# Patient Record
Sex: Male | Born: 1965
Health system: Southern US, Community
[De-identification: ages and names within clinical notes are randomized; demographics above are authoritative.]

## PROBLEM LIST (undated history)

## (undated) ENCOUNTER — Emergency Department (HOSPITAL_COMMUNITY): Discharge status: Absent without leave | Payer: Managed Care, Other (non HMO)

## (undated) DIAGNOSIS — F17200 Nicotine dependence, unspecified, uncomplicated: Secondary | ICD-10-CM

## (undated) HISTORY — DX: Nicotine dependence, unspecified, uncomplicated: F17.200

## (undated) HISTORY — PX: TONSILLECTOMY: SUR1361

---

## 2010-04-29 ENCOUNTER — Ambulatory Visit: Payer: Self-pay | Admitting: Family Medicine

## 2010-06-13 ENCOUNTER — Emergency Department (HOSPITAL_COMMUNITY): Admission: EM | Admit: 2010-06-13 | Discharge: 2010-06-13 | Payer: Self-pay | Admitting: Emergency Medicine

## 2010-06-16 ENCOUNTER — Ambulatory Visit: Payer: Self-pay | Admitting: Family Medicine

## 2010-07-28 ENCOUNTER — Ambulatory Visit: Payer: Self-pay | Admitting: Family Medicine

## 2013-01-26 ENCOUNTER — Ambulatory Visit (INDEPENDENT_AMBULATORY_CARE_PROVIDER_SITE_OTHER): Payer: Managed Care, Other (non HMO) | Admitting: Family Medicine

## 2013-01-26 ENCOUNTER — Encounter: Payer: Self-pay | Admitting: Family Medicine

## 2013-01-26 VITALS — BP 124/88 | HR 74 | Ht 70.0 in | Wt 187.0 lb

## 2013-01-26 DIAGNOSIS — Z23 Encounter for immunization: Secondary | ICD-10-CM

## 2013-01-26 DIAGNOSIS — Z0000 Encounter for general adult medical examination without abnormal findings: Secondary | ICD-10-CM

## 2013-01-26 DIAGNOSIS — F17200 Nicotine dependence, unspecified, uncomplicated: Secondary | ICD-10-CM

## 2013-01-26 DIAGNOSIS — L259 Unspecified contact dermatitis, unspecified cause: Secondary | ICD-10-CM

## 2013-01-26 DIAGNOSIS — N500 Atrophy of testis: Secondary | ICD-10-CM

## 2013-01-26 DIAGNOSIS — L309 Dermatitis, unspecified: Secondary | ICD-10-CM

## 2013-01-26 LAB — CBC WITH DIFFERENTIAL/PLATELET
Basophils Absolute: 0 10*3/uL (ref 0.0–0.1)
Eosinophils Absolute: 0.5 10*3/uL (ref 0.0–0.7)
HCT: 43.1 % (ref 39.0–52.0)
Lymphocytes Relative: 31 % (ref 12–46)
MCH: 29.2 pg (ref 26.0–34.0)
MCV: 83.2 fL (ref 78.0–100.0)
Monocytes Absolute: 0.8 10*3/uL (ref 0.1–1.0)
Neutrophils Relative %: 56 % (ref 43–77)
Platelets: 301 10*3/uL (ref 150–400)
RBC: 5.18 MIL/uL (ref 4.22–5.81)

## 2013-01-26 LAB — COMPREHENSIVE METABOLIC PANEL
ALT: 21 U/L (ref 0–53)
AST: 20 U/L (ref 0–37)
Albumin: 4.5 g/dL (ref 3.5–5.2)
Calcium: 9.6 mg/dL (ref 8.4–10.5)
Creat: 0.83 mg/dL (ref 0.50–1.35)
Potassium: 4.3 mEq/L (ref 3.5–5.3)

## 2013-01-26 LAB — LIPID PANEL
Cholesterol: 183 mg/dL (ref 0–200)
LDL Cholesterol: 110 mg/dL — ABNORMAL HIGH (ref 0–99)
Total CHOL/HDL Ratio: 3.4 Ratio
VLDL: 19 mg/dL (ref 0–40)

## 2013-01-26 NOTE — Progress Notes (Signed)
Subjective:    Patient ID: Thomas Day, male    DOB: [birth year], 47 y.o.   MRN: [id number]  HPI He is here for a complete examination. He has had difficulty with skin problems and has seen dermatologist about in the past. This usually bothers him more in the winter months. 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 He smokes and at this time is not ready to quit. His marriage is going well. Work is going well. He has no other concerns or complaints. Social and family history were reviewed. His father has an unknown kind of leukemia.   Review of Systems  Constitutional: Negative.   HENT: Negative.   Eyes: Negative.   Respiratory: Negative.   Cardiovascular: Negative.   Gastrointestinal: Negative.   Genitourinary: Negative.   Musculoskeletal: Negative.   Skin: Negative.   Neurological: Negative.   Hematological: Negative.   Psychiatric/Behavioral: Negative.        Objective:   Physical Exam BP 124/88  Pulse 74  Ht 5\' 10"  (1.778 m)  Wt 187 lb (84.823 kg)  BMI 26.83 kg/m2  General Appearance:    Alert, cooperative, no distress, appears stated age  Head:    Normocephalic, without obvious abnormality, atraumatic  Eyes:    PERRL, conjunctiva/corneas clear, EOM's intact, fundi    benign  Ears:    Normal TM's and external ear canals  Nose:   Nares normal, mucosa normal, no drainage or sinus   tenderness  Throat:   Lips, mucosa, and tongue normal; teeth and gums normal  Neck:   Supple, no lymphadenopathy;  thyroid:  no   enlargement/tenderness/nodules; no carotid   bruit or JVD  Back:    Spine nontender, no curvature, ROM normal, no CVA     tenderness  Lungs:     Clear to auscultation bilaterally without wheezes, rales or     ronchi; respirations unlabored  Chest Wall:    No tenderness or deformity   Heart:    Regular rate and rhythm, S1 and S2 normal, no murmur, rub   or gallop  Breast Exam:    No chest wall  tenderness, masses or gynecomastia  Abdomen:     Soft, non-tender, nondistended, normoactive bowel sounds,    no masses, no hepatosplenomegaly  Genitalia:    Normal male external genitalia without lesions.  Left testicle is atrophied, right is normal.  No inguinal hernias.  Rectal:    Normal sphincter tone, no masses or tenderness; guaiac negative stool.  Prostate smooth, no nodules, not enlarged.  Extremities:   No clubbing, cyanosis or edema  Pulses:   2+ and symmetric all extremities  Skin:   Skin color, texture, turgor normal, no rashes or lesions  Lymph nodes:   Cervical, supraclavicular, and axillary nodes normal  Neurologic:   CNII-XII intact, normal strength, sensation and gait; reflexes 2+ and symmetric throughout          Psych:   Normal mood, affect, hygiene and grooming.           Assessment & Plan:   1. Routine general medical examination at a health care facility  Lipid panel, CBC with Differential, Comprehensive metabolic panel, Hemoccult - 1 Card (office), Tdap vaccine greater than or equal to 7yo IM  2. Testicular atrophy    3. Dermatitis     he'll continue to be followed by dermatology. At this point is not ready to quit smoking.

## 2013-01-27 NOTE — Progress Notes (Signed)
Quick Note:  PT INFORMED LABS LOOK GOOD AND THAT I FAXED HIS INSURANCE PAPER TO COMPANY PT VERBALIZED UNDERSTANDING ______

## 2013-01-27 NOTE — Progress Notes (Signed)
Quick Note:  The blood work is normal ______ 

## 2014-02-08 ENCOUNTER — Encounter: Payer: Self-pay | Admitting: Family Medicine

## 2014-02-08 ENCOUNTER — Ambulatory Visit (INDEPENDENT_AMBULATORY_CARE_PROVIDER_SITE_OTHER): Payer: Managed Care, Other (non HMO) | Admitting: Family Medicine

## 2014-02-08 VITALS — BP 120/82 | HR 72 | Ht 70.0 in | Wt 189.0 lb

## 2014-02-08 DIAGNOSIS — F17200 Nicotine dependence, unspecified, uncomplicated: Secondary | ICD-10-CM

## 2014-02-08 DIAGNOSIS — Z0000 Encounter for general adult medical examination without abnormal findings: Secondary | ICD-10-CM

## 2014-02-08 LAB — LIPID PANEL
CHOLESTEROL: 185 mg/dL (ref 0–200)
HDL: 51 mg/dL (ref >39)
LDL CALC: 115 mg/dL — AB (ref 0–99)
Total CHOL/HDL Ratio: 3.6 Ratio
Triglycerides: 95 mg/dL (ref <150)
VLDL: 19 mg/dL (ref 0–40)

## 2014-02-08 LAB — HEMOCCULT GUIAC POC 1CARD (OFFICE)

## 2014-02-08 NOTE — Progress Notes (Signed)
   Subjective:    Patient ID: Thomas Day, male    DOB: 10/13/1966, 48 y.o.   MRN: [id number]  HPI He is here for complete examination. He has no particular concerns or complaints. His work is going well. His marriage is also going well. He has 2 young children. Social and family history were reviewed. He does smoke and has tried quitting in the past. Health maintenance and immunizations were reviewed.   Review of Systems  All other systems reviewed and are negative.       Objective:   Physical Exam BP 120/82  Pulse 72  Ht 5\' 10"  (1.778 m)  Wt 189 lb (85.73 kg)  BMI 27.12 kg/m2  General Appearance:    Alert, cooperative, no distress, appears stated age  Head:    Normocephalic, without obvious abnormality, atraumatic  Eyes:    PERRL, conjunctiva/corneas clear, EOM's intact, fundi    benign  Ears:    Normal TM's and external ear canals  Nose:   Nares normal, mucosa normal, no drainage or sinus   tenderness  Throat:   Lips, mucosa, and tongue normal; teeth and gums normal  Neck:   Supple, no lymphadenopathy;  thyroid:  no   enlargement/tenderness/nodules; no carotid   bruit or JVD  Back:    Spine nontender, no curvature, ROM normal, no CVA     tenderness  Lungs:     Clear to auscultation bilaterally without wheezes, rales or     ronchi; respirations unlabored  Chest Wall:    No tenderness or deformity   Heart:    Regular rate and rhythm, S1 and S2 normal, no murmur, rub   or gallop  Breast Exam:    No chest wall tenderness, masses or gynecomastia  Abdomen:     Soft, non-tender, nondistended, normoactive bowel sounds,    no masses, no hepatosplenomegaly  Genitalia:   deferred   Rectal:    Normal sphincter tone, no masses or tenderness; guaiac negative stool.  Prostate smooth, no nodules, not enlarged.  Extremities:   No clubbing, cyanosis or edema  Pulses:   2+ and symmetric all extremities  Skin:   Skin color, texture, turgor normal, no rashes or lesions  Lymph nodes:    Cervical, supraclavicular, and axillary nodes normal  Neurologic:   CNII-XII intact, normal strength, sensation and gait; reflexes 2+ and symmetric throughout          Psych:   Normal mood, affect, hygiene and grooming.          Assessment & Plan:  Routine general medical examination at a health care facility - Plan: Visual acuity screening, Lipid panel, POCT occult blood stool  Current smoker  I discussed smoking in regard to habit versus addiction. Recommend he call the 800 quit now number. Also discussed substituting other items or things to do instead of smoking when he has that psychological urge.

## 2014-02-08 NOTE — Patient Instructions (Signed)
Call 800 quit now 

## 2014-06-29 ENCOUNTER — Emergency Department (HOSPITAL_COMMUNITY): Payer: Managed Care, Other (non HMO)

## 2014-06-29 ENCOUNTER — Encounter (HOSPITAL_COMMUNITY): Payer: Self-pay | Admitting: Emergency Medicine

## 2014-06-29 ENCOUNTER — Emergency Department (HOSPITAL_COMMUNITY)
Admission: EM | Admit: 2014-06-29 | Discharge: 2014-06-29 | Disposition: A | Discharge status: Home / Self Care | Payer: Managed Care, Other (non HMO) | Attending: Emergency Medicine | Admitting: Emergency Medicine

## 2014-06-29 DIAGNOSIS — X500XXA Overexertion from strenuous movement or load, initial encounter: Secondary | ICD-10-CM | POA: Insufficient documentation

## 2014-06-29 DIAGNOSIS — Y9389 Activity, other specified: Secondary | ICD-10-CM | POA: Insufficient documentation

## 2014-06-29 DIAGNOSIS — S93602A Unspecified sprain of left foot, initial encounter: Secondary | ICD-10-CM

## 2014-06-29 DIAGNOSIS — Y929 Unspecified place or not applicable: Secondary | ICD-10-CM | POA: Insufficient documentation

## 2014-06-29 DIAGNOSIS — F17200 Nicotine dependence, unspecified, uncomplicated: Secondary | ICD-10-CM | POA: Insufficient documentation

## 2014-06-29 DIAGNOSIS — Z79899 Other long term (current) drug therapy: Secondary | ICD-10-CM | POA: Insufficient documentation

## 2014-06-29 DIAGNOSIS — S93609A Unspecified sprain of unspecified foot, initial encounter: Secondary | ICD-10-CM | POA: Insufficient documentation

## 2014-06-29 MED ORDER — IBUPROFEN 800 MG PO TABS: 800.0000 mg | ORAL_TABLET | Freq: Three times a day (TID) | ORAL | Status: DC | PRN

## 2014-06-29 MED ORDER — IBUPROFEN 800 MG PO TABS
800.0000 mg | ORAL_TABLET | Freq: Once | ORAL | Status: AC
  Administered 2014-06-29: 800 mg via ORAL
  Filled 2014-06-29: qty 1

## 2014-06-29 NOTE — ED Notes (Signed)
Pt states he stepped on a rock and twisted his left ankle. Some swelling and bruising noted.

## 2014-06-29 NOTE — ED Provider Notes (Signed)
CSN: [id number]     Arrival date & time 06/29/14  1950 History   First MD Initiated Contact with Patient 06/29/14 2046     Chief Complaint  Patient presents with  . Ankle Pain    Patient is a 48 y.o. male presenting with ankle pain. The history is provided by the patient.  Ankle Pain Location:  Ankle and foot Time since incident: This evening. Ankle location:  L ankle Foot location:  L foot Pain details:    Quality:  Aching and sharp   Radiates to:  Does not radiate   Severity:  Moderate   Onset quality:  Sudden (Pt twisted his left ankle on a rock.  He has had increasing pain since then)   Duration:  1 day   Timing:  Constant   Progression:  Worsening Chronicity:  New Worsened by:  Activity and bearing weight Associated symptoms: decreased ROM and swelling   Associated symptoms: no muscle weakness     History reviewed. No pertinent past medical history. History reviewed. No pertinent past surgical history. Family History  Problem Relation Age of Onset  . Cancer Father     Leukemia   History  Substance Use Topics  . Smoking status: Current Every Day Smoker  . Smokeless tobacco: Never Used  . Alcohol Use: 0.6 oz/week    1 Shots of liquor per week    Review of Systems  All other systems reviewed and are negative.     Allergies  Review of patient's allergies indicates no known allergies.  Home Medications   Prior to Admission medications   Medication Sig Start Date End Date Taking? Authorizing Provider  ibuprofen (ADVIL,MOTRIN) 800 MG tablet Take 1 tablet (800 mg total) by mouth every 8 (eight) hours as needed. 06/29/14   Linwood Dibbles, MD   BP 135/88  Pulse 83  Temp(Src) 98.1 F (36.7 C) (Oral)  Resp 17  Ht 5\' 10"  (1.778 m)  Wt 195 lb (88.451 kg)  BMI 27.98 kg/m2  SpO2 98% Physical Exam  Nursing note and vitals reviewed. Constitutional: He appears well-developed and well-nourished. No distress.  HENT:  Head: Normocephalic and atraumatic.  Right Ear:  External ear normal.  Left Ear: External ear normal.  Eyes: Conjunctivae are normal. Right eye exhibits no discharge. Left eye exhibits no discharge. No scleral icterus.  Neck: Neck supple. No tracheal deviation present.  Cardiovascular: Normal rate.   Pulmonary/Chest: Effort normal. No stridor. No respiratory distress.  Musculoskeletal: He exhibits no edema.       Left ankle: He exhibits no swelling and no deformity. Tenderness. Head of 5th metatarsal tenderness found. No proximal fibula tenderness found.       Left foot: He exhibits bony tenderness.  Neurological: He is alert. Cranial nerve deficit: no gross deficits.  Skin: Skin is warm and dry. No rash noted.  Psychiatric: He has a normal mood and affect.    ED Course  Procedures (including critical care time) Labs Review Labs Reviewed - No data to display  Imaging Review Dg Ankle Complete Left  06/29/2014   CLINICAL DATA:  Twisting injury left ankle.  EXAM: LEFT ANKLE COMPLETE - 3+ VIEW  COMPARISON:  None.  FINDINGS: Imaged bones, joints and soft tissues appear normal.  IMPRESSION: Negative exam.   Electronically Signed   By: Drusilla Kanner M.D.   On: 06/29/2014 20:35   Dg Foot Complete Left  06/29/2014   CLINICAL DATA:  Trauma.  EXAM: LEFT FOOT - COMPLETE 3+ VIEW  COMPARISON:  None.  FINDINGS: There is no evidence of fracture or dislocation. There is no evidence of arthropathy or other focal bone abnormality. Soft tissues are unremarkable.  IMPRESSION: Negative.   Electronically Signed   By: Maisie Fushomas  Register   On: 06/29/2014 21:06      MDM   Final diagnoses:  Foot sprain, left, initial encounter    No fracture or other abnormality on xray.  Dc home with crutches, splint and pain meds.   Linwood DibblesJon Laurianne Floresca, MD 06/30/14 [phone]

## 2014-06-29 NOTE — Discharge Instructions (Signed)
Foot Sprain The muscles and cord like structures which attach muscle to bone (tendons) that surround the feet are made up of units. A foot sprain can occur at the weakest spot in any of these units. This condition is most often caused by injury to or overuse of the foot, as from playing contact sports, or aggravating a previous injury, or from poor conditioning, or obesity. SYMPTOMS  Pain with movement of the foot.  Tenderness and swelling at the injury site.  Loss of strength is present in moderate or severe sprains. THE THREE GRADES OR SEVERITY OF FOOT SPRAIN ARE:  Mild (Grade I): Slightly pulled muscle without tearing of muscle or tendon fibers or loss of strength.  Moderate (Grade II): Tearing of fibers in a muscle, tendon, or at the attachment to bone, with small decrease in strength.  Severe (Grade III): Rupture of the muscle-tendon-bone attachment, with separation of fibers. Severe sprain requires surgical repair. Often repeating (chronic) sprains are caused by overuse. Sudden (acute) sprains are caused by direct injury or over-use. DIAGNOSIS  Diagnosis of this condition is usually by your own observation. If problems continue, a caregiver may be required for further evaluation and treatment. X-rays may be required to make sure there are not breaks in the bones (fractures) present. Continued problems may require physical therapy for treatment. PREVENTION  Use strength and conditioning exercises appropriate for your sport.  Warm up properly prior to working out.  Use athletic shoes that are made for the sport you are participating in.  Allow adequate time for healing. Early return to activities makes repeat injury more likely, and can lead to an unstable arthritic foot that can result in prolonged disability. Mild sprains generally heal in 3 to 10 days, with moderate and severe sprains taking 2 to 10 weeks. Your caregiver can help you determine the proper time required for  healing. HOME CARE INSTRUCTIONS   Apply ice to the injury for 15-20 minutes, 03-04 times per day. Put the ice in a plastic bag and place a towel between the bag of ice and your skin.  An elastic wrap (like an Ace bandage) may be used to keep swelling down.  Keep foot above the level of the heart, or at least raised on a footstool, when swelling and pain are present.  Try to avoid use other than gentle range of motion while the foot is painful. Do not resume use until instructed by your caregiver. Then begin use gradually, not increasing use to the point of pain. If pain does develop, decrease use and continue the above measures, gradually increasing activities that do not cause discomfort, until you gradually achieve normal use.  Use crutches if and as instructed, and for the length of time instructed.  Keep injured foot and ankle wrapped between treatments.  Massage foot and ankle for comfort and to keep swelling down. Massage from the toes up towards the knee.  Only take over-the-counter or prescription medicines for pain, discomfort, or fever as directed by your caregiver. SEEK IMMEDIATE MEDICAL CARE IF:   Your pain and swelling increase, or pain is not controlled with medications.  You have loss of feeling in your foot or your foot turns cold or blue.  You develop new, unexplained symptoms, or an increase of the symptoms that brought you to your caregiver. MAKE SURE YOU:   Understand these instructions.  Will watch your condition.  Will get help right away if you are not doing well or get worse. Document Released:   05/29/2002 Document Revised: 02/29/2012 Document Reviewed: 07/26/2008 Exit[employer] Patient Information 2015 [employer]. This information is not intended to replace advice given to you by your health care provider. Make sure you discuss any questions you have with your health care provider.  

## 2014-10-26 ENCOUNTER — Emergency Department (HOSPITAL_COMMUNITY)
Admission: EM | Admit: 2014-10-26 | Discharge: 2014-10-27 | Disposition: A | Discharge status: Home / Self Care | Payer: Managed Care, Other (non HMO) | Attending: Emergency Medicine | Admitting: Emergency Medicine

## 2014-10-26 ENCOUNTER — Emergency Department (HOSPITAL_COMMUNITY): Payer: Managed Care, Other (non HMO)

## 2014-10-26 ENCOUNTER — Encounter (HOSPITAL_COMMUNITY): Payer: Self-pay | Admitting: Emergency Medicine

## 2014-10-26 DIAGNOSIS — Z720 Tobacco use: Secondary | ICD-10-CM | POA: Insufficient documentation

## 2014-10-26 DIAGNOSIS — Z79899 Other long term (current) drug therapy: Secondary | ICD-10-CM | POA: Insufficient documentation

## 2014-10-26 DIAGNOSIS — N508 Other specified disorders of male genital organs: Secondary | ICD-10-CM | POA: Diagnosis not present

## 2014-10-26 DIAGNOSIS — R1909 Other intra-abdominal and pelvic swelling, mass and lump: Secondary | ICD-10-CM | POA: Diagnosis present

## 2014-10-26 DIAGNOSIS — R609 Edema, unspecified: Secondary | ICD-10-CM

## 2014-10-26 DIAGNOSIS — N492 Inflammatory disorders of scrotum: Secondary | ICD-10-CM | POA: Insufficient documentation

## 2014-10-26 DIAGNOSIS — N5089 Other specified disorders of the male genital organs: Secondary | ICD-10-CM

## 2014-10-26 DIAGNOSIS — L0390 Cellulitis, unspecified: Secondary | ICD-10-CM

## 2014-10-26 LAB — CBC WITH DIFFERENTIAL/PLATELET
Basophils Absolute: 0 10*3/uL (ref 0.0–0.1)
Basophils Relative: 0 % (ref 0–1)
Eosinophils Absolute: 0.2 10*3/uL (ref 0.0–0.7)
Eosinophils Relative: 1 % (ref 0–5)
HCT: 43.2 % (ref 39.0–52.0)
Hemoglobin: 15.2 g/dL (ref 13.0–17.0)
Lymphocytes Relative: 18 % (ref 12–46)
Lymphs Abs: 3.1 10*3/uL (ref 0.7–4.0)
MCH: 30.5 pg (ref 26.0–34.0)
MCHC: 35.2 g/dL (ref 30.0–36.0)
MCV: 86.6 fL (ref 78.0–100.0)
Monocytes Absolute: 1.2 10*3/uL — ABNORMAL HIGH (ref 0.1–1.0)
Monocytes Relative: 7 % (ref 3–12)
Neutro Abs: 13 10*3/uL — ABNORMAL HIGH (ref 1.7–7.7)
Neutrophils Relative %: 74 % (ref 43–77)
Platelets: 347 10*3/uL (ref 150–400)
RBC: 4.99 MIL/uL (ref 4.22–5.81)
RDW: 13.6 % (ref 11.5–15.5)
WBC: 17.6 10*3/uL — ABNORMAL HIGH (ref 4.0–10.5)

## 2014-10-26 NOTE — ED Notes (Signed)
Pt off the unit for ultrasound

## 2014-10-26 NOTE — ED Notes (Signed)
Pt arrived to the ED with a complaint of scrotum swelling.  Pt states that he had sudden lower lip swelling followed by scrotum swelling that commenced around 4pm.  Pt states lip swelling has subsided but that scrotal swelling has increased

## 2014-10-26 NOTE — ED Notes (Signed)
Patient transported to Ultrasound 

## 2014-10-26 NOTE — ED Notes (Signed)
Pt. Made aware for the need of urine. 

## 2014-10-27 LAB — COMPREHENSIVE METABOLIC PANEL
ALT: 19 U/L (ref 0–53)
AST: 17 U/L (ref 0–37)
Albumin: 3.6 g/dL (ref 3.5–5.2)
Alkaline Phosphatase: 63 U/L (ref 39–117)
Anion gap: 11 (ref 5–15)
BUN: 12 mg/dL (ref 6–23)
CO2: 26 mEq/L (ref 19–32)
Calcium: 9 mg/dL (ref 8.4–10.5)
Chloride: 100 mEq/L (ref 96–112)
Creatinine, Ser: 0.95 mg/dL (ref 0.50–1.35)
GFR calc Af Amer: >90 mL/min (ref >90)
GFR calc non Af Amer: >90 mL/min (ref >90)
Glucose, Bld: 103 mg/dL — ABNORMAL HIGH (ref 70–99)
Potassium: 4.1 mEq/L (ref 3.7–5.3)
Sodium: 137 mEq/L (ref 137–147)
Total Bilirubin: 0.3 mg/dL (ref 0.3–1.2)
Total Protein: 6.6 g/dL (ref 6.0–8.3)

## 2014-10-27 MED ORDER — CLINDAMYCIN HCL 150 MG PO CAPS: 450.0000 mg | ORAL_CAPSULE | Freq: Three times a day (TID) | ORAL | Status: DC

## 2014-10-27 MED ORDER — CLINDAMYCIN HCL 300 MG PO CAPS
450.0000 mg | ORAL_CAPSULE | Freq: Once | ORAL | Status: AC
  Administered 2014-10-27: 450 mg via ORAL
  Filled 2014-10-27: qty 1

## 2014-10-27 NOTE — ED Notes (Signed)
Pt. Refused to give urine x 2. RN,Lillibeth made aware.

## 2014-10-27 NOTE — Discharge Instructions (Signed)
Return here in 2 days for recheck or sooner for any worsening in her condition.  Also, follow up with her primary care doctor for recheck

## 2014-10-27 NOTE — ED Provider Notes (Signed)
CSN: [id number]     Arrival date & time 10/26/14  2052 History   First MD Initiated Contact with Patient 10/26/14 2159     Chief Complaint  Patient presents with  . Groin Swelling     (Consider location/radiation/quality/duration/timing/severity/associated sxs/prior Treatment) HPI Patient presents to the emergency department with complaint of scrotal swelling that started at 4 PM today and also noted lip swelling that is subsided since that time.  The patient states that he does not know of any injury to the scrotal area.  The patient states that he noted the area itched and that is when he noticed swelling.  Patient states that he has noted no wounds or sores to his scrotum.  The patient states he has not had any fever, nausea, vomiting, diarrhea, weakness, dizziness, headache, blurred vision, chest pain, shortness of breath, dysuria, syncope.  The patient states he did not take any medications prior to arrival.  Patient states that there is no pain with this swelling. History reviewed. No pertinent past medical history. History reviewed. No pertinent past surgical history. Family History  Problem Relation Age of Onset  . Cancer Father     Leukemia   History  Substance Use Topics  . Smoking status: Current Every Day Smoker  . Smokeless tobacco: Never Used  . Alcohol Use: 0.6 oz/week    1 Shots of liquor per week    Review of Systems All other systems negative except as documented in the HPI. All pertinent positives and negatives as reviewed in the HPI.    Allergies  Review of patient's allergies indicates no known allergies.  Home Medications   Prior to Admission medications   Medication Sig Start Date End Date Taking? Authorizing Provider  Halcinonide (HALOG) 0.1 % CREA Apply 1 application topically 2 (two) times daily.   Yes Historical Provider, MD  ibuprofen (ADVIL,MOTRIN) 200 MG tablet Take 800 mg by mouth every 6 (six) hours as needed for moderate pain.   Yes Historical  Provider, MD  terbinafine (LAMISIL) 250 MG tablet Take 250 mg by mouth daily.   Yes Historical Provider, MD  ibuprofen (ADVIL,MOTRIN) 800 MG tablet Take 1 tablet (800 mg total) by mouth every 8 (eight) hours as needed. 06/29/14   Linwood DibblesJon Knapp, MD   BP 141/80 mmHg  Pulse 105  Temp(Src) 98.1 F (36.7 C) (Oral)  Resp 18  SpO2 97% Physical Exam  Constitutional: He appears well-developed and well-nourished. No distress.  HENT:  Head: Normocephalic and atraumatic.  Mouth/Throat: Oropharynx is clear and moist.  Eyes: Pupils are equal, round, and reactive to light.  Neck: Normal range of motion. Neck supple.  Cardiovascular: Normal rate, regular rhythm and normal heart sounds.  Exam reveals no gallop and no friction rub.   No murmur heard. Pulmonary/Chest: Effort normal and breath sounds normal.  Genitourinary:       ED Course  Procedures (including critical care time) Labs Review Labs Reviewed  CBC WITH DIFFERENTIAL - Abnormal; Notable for the following:    WBC 17.6 (*)    Neutro Abs 13.0 (*)    Monocytes Absolute 1.2 (*)    All other components within normal limits  COMPREHENSIVE METABOLIC PANEL - Abnormal; Notable for the following:    Glucose, Bld 103 (*)    All other components within normal limits  URINALYSIS, ROUTINE W REFLEX MICROSCOPIC    Imaging Review [place] Scrotum  10/26/2014   CLINICAL DATA:  Sudden onset of scrotal swelling.  EXAM: SCROTAL ULTRASOUND  DOPPLER ULTRASOUND  OF THE TESTICLES  TECHNIQUE: Complete ultrasound examination of the testicles, epididymis, and other scrotal structures was performed. Color and spectral Doppler ultrasound were also utilized to evaluate blood flow to the testicles.  COMPARISON:  None.  FINDINGS: Right testicle  Measurements: 4.6 x 2.8 x 3.2 cm. No mass or microlithiasis visualized.  Left testicle  Measurements: 3.7 x 2.1 x 2.6 cm. No mass or microlithiasis visualized.  Right epididymis: Small right epididymal cyst or spermatocele measuring  about 4 mm diameter. Epididymis otherwise unremarkable.  Left epididymis:  Normal in size and appearance.  Hydrocele:  None visualized.  Varicocele:  None visualized.  Pulsed Doppler interrogation of both testes demonstrates low resistance arterial and venous waveforms bilaterally. Normal homogeneous flow is demonstrated in both testes and epididymides on color flow Doppler imaging.  Extensive diffuse scrotal skin thickening and edema with increased flow on color flow Doppler imaging suggesting cellulitis. No discrete abscess identified.  IMPRESSION: Testicles are normal. No evidence of testicular mass or torsion. Diffuse scrotal skin thickening and edema consistent with cellulitis.   Electronically Signed   By: Burman NievesWilliam  Stevens M.D.   On: 10/26/2014 23:20   [place] Art/ven Flow Abd Pelv Doppler  10/26/2014   CLINICAL DATA:  Sudden onset of scrotal swelling.  EXAM: SCROTAL ULTRASOUND  DOPPLER ULTRASOUND OF THE TESTICLES  TECHNIQUE: Complete ultrasound examination of the testicles, epididymis, and other scrotal structures was performed. Color and spectral Doppler ultrasound were also utilized to evaluate blood flow to the testicles.  COMPARISON:  None.  FINDINGS: Right testicle  Measurements: 4.6 x 2.8 x 3.2 cm. No mass or microlithiasis visualized.  Left testicle  Measurements: 3.7 x 2.1 x 2.6 cm. No mass or microlithiasis visualized.  Right epididymis: Small right epididymal cyst or spermatocele measuring about 4 mm diameter. Epididymis otherwise unremarkable.  Left epididymis:  Normal in size and appearance.  Hydrocele:  None visualized.  Varicocele:  None visualized.  Pulsed Doppler interrogation of both testes demonstrates low resistance arterial and venous waveforms bilaterally. Normal homogeneous flow is demonstrated in both testes and epididymides on color flow Doppler imaging.  Extensive diffuse scrotal skin thickening and edema with increased flow on color flow Doppler imaging suggesting cellulitis. No  discrete abscess identified.  IMPRESSION: Testicles are normal. No evidence of testicular mass or torsion. Diffuse scrotal skin thickening and edema consistent with cellulitis.   Electronically Signed   By: Burman NievesWilliam  Stevens M.D.   On: 10/26/2014 23:20    The patient's acute onset of scrotal swelling seems atypical for cellulitis, but is ultrasound does show findings consistent with cellulitis and his white count is 17,000, although these do not necessarily exclusively mean that he has cellulitis.  We will treat this with clindamycin and have him recheck in 2 days.  Patient is advised to return here for any worsening in his condition and advised him that there is any further swelling or redness, fevers, nausea or vomiting, he needs to return immediately.  Carlyle DollyChristopher W Saveah Bahar, PA-C 10/27/14 0050  Linwood DibblesJon Knapp, MD 10/28/14 [phone]

## 2014-10-29 ENCOUNTER — Ambulatory Visit (INDEPENDENT_AMBULATORY_CARE_PROVIDER_SITE_OTHER): Payer: Managed Care, Other (non HMO) | Admitting: Family Medicine

## 2014-10-29 ENCOUNTER — Emergency Department (HOSPITAL_COMMUNITY)
Admission: EM | Admit: 2014-10-29 | Discharge: 2014-10-29 | Disposition: A | Discharge status: Home / Self Care | Payer: Managed Care, Other (non HMO) | Attending: Emergency Medicine | Admitting: Emergency Medicine

## 2014-10-29 ENCOUNTER — Encounter (HOSPITAL_COMMUNITY): Payer: Self-pay | Admitting: Emergency Medicine

## 2014-10-29 ENCOUNTER — Encounter: Payer: Self-pay | Admitting: Family Medicine

## 2014-10-29 VITALS — BP 130/88 | HR 76 | Wt 193.0 lb

## 2014-10-29 DIAGNOSIS — Z79899 Other long term (current) drug therapy: Secondary | ICD-10-CM | POA: Diagnosis not present

## 2014-10-29 DIAGNOSIS — Z720 Tobacco use: Secondary | ICD-10-CM | POA: Diagnosis not present

## 2014-10-29 DIAGNOSIS — N492 Inflammatory disorders of scrotum: Secondary | ICD-10-CM | POA: Insufficient documentation

## 2014-10-29 DIAGNOSIS — T783XXD Angioneurotic edema, subsequent encounter: Secondary | ICD-10-CM

## 2014-10-29 DIAGNOSIS — Z791 Long term (current) use of non-steroidal anti-inflammatories (NSAID): Secondary | ICD-10-CM | POA: Diagnosis not present

## 2014-10-29 DIAGNOSIS — N508 Other specified disorders of male genital organs: Secondary | ICD-10-CM | POA: Diagnosis present

## 2014-10-29 NOTE — Discharge Instructions (Signed)
Continue current antibiotics to complete course. Follow-up with your family doctor today as scheduled. Please get rechecked immediately if he develops new or concerning swelling or fevers.

## 2014-10-29 NOTE — ED Notes (Signed)
Upper lip mildly swollen, no redness, no pain, no bruising. Pt states tongue was swollen yesterday morning, no swelling observed at this time. Refer to previous assessment for further information. Pt states no needs, call light in reach.

## 2014-10-29 NOTE — Progress Notes (Signed)
   Subjective:    Patient ID: Thomas Day, male    DOB: 01/28/1966, 48 y.o.   MRN: [id number]  HPI He is here for a recheck. Over the weekend he did have difficulty with lip and tongue swelling as well as scrotal swelling. He was seen several times in the emergency room. The diagnosis of angioedema was entertained however he was placed on antibiotic for the scrotal problem. At this time he is having no more difficulty. He did wake up one morning over the weekend with swelling of the time they go away after several hours.   Review of Systems     Objective:   Physical Exam  Alert and in no distress. Oral exam shows no swelling. Genital exam does show some slight erythema to the anterior scrotal area but no edema.      Assessment & Plan:  Angioedema, subsequent encounter I discussed the diagnosis of angioedema and the possible triggers for this. Information was given to he will be vigilant in terms of return of his symptoms and we will pursue this further if it recurs.

## 2014-10-29 NOTE — ED Provider Notes (Signed)
CSN: [id number]     Arrival date & time 10/29/14  0941 History   First MD Initiated Contact with Patient 10/29/14 [phone]     Chief Complaint  Patient presents with  . Follow-up      HPI Comments: Patient presents for repeat evaluation of scrotal swelling. Patient was seen in the emergency department 3 days ago for sudden onset scrotal edema and was diagnosed with possible cellulitis at that time. He was started on oral clindamycin. Since that time he reports moderate improvement in his scrotal swelling. He denies fevers, nausea, vomiting, or dysuria. He did have an episode yesterday of swelling of his tongue that lasted 2 hours and then resolved. He has no history of angioedema and other then clindamycin he is taking no home medications.  The history is provided by the patient.    History reviewed. No pertinent past medical history. History reviewed. No pertinent past surgical history. Family History  Problem Relation Age of Onset  . Cancer Father     Leukemia   History  Substance Use Topics  . Smoking status: Current Every Day Smoker  . Smokeless tobacco: Never Used  . Alcohol Use: 0.6 oz/week    1 Shots of liquor per week    Review of Systems  All other systems reviewed and are negative.     Allergies  Review of patient's allergies indicates no known allergies.  Home Medications   Prior to Admission medications   Medication Sig Start Date End Date Taking? Authorizing Provider  clindamycin (CLEOCIN) 150 MG capsule Take 3 capsules (450 mg total) by mouth 3 (three) times daily. 10/27/14   Jamesetta Orleanshristopher W Lawyer, PA-C  Halcinonide (HALOG) 0.1 % CREA Apply 1 application topically 2 (two) times daily.    Historical Provider, MD  ibuprofen (ADVIL,MOTRIN) 200 MG tablet Take 800 mg by mouth every 6 (six) hours as needed for moderate pain.    Historical Provider, MD  ibuprofen (ADVIL,MOTRIN) 800 MG tablet Take 1 tablet (800 mg total) by mouth every 8 (eight) hours as needed. 06/29/14    Linwood DibblesJon Knapp, MD  terbinafine (LAMISIL) 250 MG tablet Take 250 mg by mouth daily.    Historical Provider, MD   BP 126/81 mmHg  Pulse 92  Temp(Src) 98 F (36.7 C) (Oral)  Resp 18  SpO2 97% Physical Exam  Constitutional: He is oriented to person, place, and time. He appears well-developed and well-nourished.  No acute distress  HENT:  Head: Normocephalic and atraumatic.  Pulmonary/Chest: Effort normal.  Abdominal: Soft.  Genitourinary:  Mild scrotal edema without erythema. Minimal scrotal tenderness. Ecchymosis of the scrotum. No surrounding rashes.  Musculoskeletal: He exhibits no edema or tenderness.  Neurological: He is alert and oriented to person, place, and time.  Skin: Skin is warm and dry.  Psychiatric: He has a normal mood and affect.  Nursing note and vitals reviewed.   ED Course  Procedures (including critical care time) Labs Review Labs Reviewed - No data to display  Imaging Review No results found.   EKG Interpretation None      MDM   Final diagnoses:  Cellulitis of scrotum    Patient here for recheck of scrotal cellulitis. Patient is symptomatically improved from previous evaluation and exam is consistent with resolving cellulitis. Given historical information question if patient has some element of angioedema. Discussed with patient home care, return precautions, as well as need for continued follow-up and evaluation.  Current clinical picture not consistent with torsion, abscess, or serious bacterial infection.  Tilden FossaElizabeth Anouk Critzer, MD 10/29/14 1057

## 2014-10-29 NOTE — ED Notes (Signed)
Pt here for follow-up from 11/6 for swollen scrotum, pt states swelling has decreased.

## 2014-10-29 NOTE — Patient Instructions (Signed)
Angioedema °Angioedema is a sudden swelling of tissues, often of the skin. It can occur on the face or genitals or in the abdomen or other body parts. The swelling usually develops over a short period and gets better in 24 to 48 hours. It often begins during the night and is found when the person wakes up. The person may also get red, itchy patches of skin (hives). Angioedema can be dangerous if it involves swelling of the air passages.  °Depending on the cause, episodes of angioedema may only happen once, come back in unpredictable patterns, or repeat for several years and then gradually fade away.  °CAUSES  °Angioedema can be caused by an allergic reaction to various triggers. It can also result from nonallergic causes, including reactions to drugs, immune system disorders, viral infections, or an abnormal gene that is passed to you from your parents (hereditary). For some people with angioedema, the cause is unknown.  °Some things that can trigger angioedema include:  °· Foods.   °· Medicines, such as ACE inhibitors, ARBs, nonsteroidal anti-inflammatory agents, or estrogen.   °· Latex.   °· Animal saliva.   °· Insect stings.   °· Dyes used in X-rays.   °· Mild injury.   °· Dental work. °· Surgery. °· Stress.   °· Sudden changes in temperature.   °· Exercise. °SIGNS AND SYMPTOMS  °· Swelling of the skin. °· Hives. If these are present, there is also intense itching. °· Redness in the affected area.   °· Pain in the affected area. °· Swollen lips or tongue. °· Breathing problems. This may happen if the air passages swell. °· Wheezing. °If internal organs are involved, there may be:  °· Nausea.   °· Abdominal pain.   °· Vomiting.   °· Difficulty swallowing.   °· Difficulty passing urine. °DIAGNOSIS  °· Your health care provider will examine the affected area and take a medical and family history. °· Various tests may be done to help determine the cause. Tests may include: °¨ Allergy skin tests to see if the problem  is an allergic reaction.   °¨ Blood tests to check for hereditary angioedema.   °¨ Tests to check for underlying diseases that could cause the condition.   °· A review of your medicines, including over-the-counter medicines, may be done. °TREATMENT  °Treatment will depend on the cause of the angioedema. Possible treatments include:  °· Removal of anything that triggered the condition (such as stopping certain medicines).   °· Medicines to treat symptoms or prevent attacks. Medicines given may include:   °¨ Antihistamines.   °¨ Epinephrine injection.   °¨ Steroids.   °· Hospitalization may be required for severe attacks. If the air passages are affected, it can be an emergency. Tubes may need to be placed to keep the airway open. °HOME CARE INSTRUCTIONS  °· Take all medicines as directed by your health care provider. °· If you were given medicines for emergency allergy treatment, always carry them with you. °· Wear a medical bracelet as directed by your health care provider.   °· Avoid known triggers. °SEEK MEDICAL CARE IF:  °· You have repeat attacks of angioedema.   °· Your attacks are more frequent or more severe despite preventive measures.   °· You have hereditary angioedema and are considering having children. It is important to discuss with your health care provider the risks of passing the condition on to your children. °SEEK IMMEDIATE MEDICAL CARE IF:  °· You have severe swelling of the mouth, tongue, or lips. °· You have difficulty breathing.   °· You have difficulty swallowing.   °· You faint. °MAKE   SURE YOU: °· Understand these instructions. °· Will watch your condition. °· Will get help right away if you are not doing well or get worse. °Document Released: 02/15/2002 Document Revised: 04/23/2014 Document Reviewed: 07/31/2013 °[employer]® Patient Information ©2015 [employer]. This information is not intended to replace advice given to you by your health care provider. Make sure you discuss any questions  you have with your health care provider. ° °

## 2014-10-31 ENCOUNTER — Encounter: Payer: Self-pay | Admitting: Family Medicine

## 2014-10-31 ENCOUNTER — Ambulatory Visit (INDEPENDENT_AMBULATORY_CARE_PROVIDER_SITE_OTHER): Payer: Managed Care, Other (non HMO) | Admitting: Family Medicine

## 2014-10-31 VITALS — BP 124/70 | HR 92 | Temp 97.0°F | Ht 70.0 in | Wt 194.0 lb

## 2014-10-31 DIAGNOSIS — T783XXA Angioneurotic edema, initial encounter: Secondary | ICD-10-CM

## 2014-10-31 DIAGNOSIS — R220 Localized swelling, mass and lump, head: Secondary | ICD-10-CM

## 2014-10-31 DIAGNOSIS — M25441 Effusion, right hand: Secondary | ICD-10-CM

## 2014-10-31 LAB — CBC WITH DIFFERENTIAL/PLATELET
BASOS ABS: 0 10*3/uL (ref 0.0–0.1)
BASOS PCT: 0 % (ref 0–1)
EOS PCT: 1 % (ref 0–5)
Eosinophils Absolute: 0.2 10*3/uL (ref 0.0–0.7)
HCT: 44.2 % (ref 39.0–52.0)
Hemoglobin: 15.1 g/dL (ref 13.0–17.0)
LYMPHS ABS: 2.3 10*3/uL (ref 0.7–4.0)
Lymphocytes Relative: 15 % (ref 12–46)
MCH: 29.8 pg (ref 26.0–34.0)
MCHC: 34.2 g/dL (ref 30.0–36.0)
MCV: 87.2 fL (ref 78.0–100.0)
MONO ABS: 0.8 10*3/uL (ref 0.1–1.0)
MONOS PCT: 5 % (ref 3–12)
NEUTROS PCT: 79 % — AB (ref 43–77)
Neutro Abs: 12 10*3/uL — ABNORMAL HIGH (ref 1.7–7.7)
PLATELETS: 336 10*3/uL (ref 150–400)
RBC: 5.07 MIL/uL (ref 4.22–5.81)
RDW: 13.8 % (ref 11.5–15.5)
WBC: 15.2 10*3/uL — ABNORMAL HIGH (ref 4.0–10.5)

## 2014-10-31 LAB — COMPREHENSIVE METABOLIC PANEL
ALBUMIN: 3.8 g/dL (ref 3.5–5.2)
ALT: 24 U/L (ref 0–53)
AST: 42 U/L — ABNORMAL HIGH (ref 0–37)
Alkaline Phosphatase: 61 U/L (ref 39–117)
BILIRUBIN TOTAL: 1.1 mg/dL (ref 0.2–1.2)
BUN: 7 mg/dL (ref 6–23)
CO2: 23 mEq/L (ref 19–32)
CREATININE: 0.75 mg/dL (ref 0.50–1.35)
Calcium: 8.8 mg/dL (ref 8.4–10.5)
Chloride: 103 mEq/L (ref 96–112)
GLUCOSE: 82 mg/dL (ref 70–99)
POTASSIUM: 3.9 meq/L (ref 3.5–5.3)
SODIUM: 136 meq/L (ref 135–145)
Total Protein: 6.3 g/dL (ref 6.0–8.3)

## 2014-10-31 LAB — C-REACTIVE PROTEIN: CRP: 0.7 mg/dL — ABNORMAL HIGH (ref <0.60)

## 2014-10-31 MED ORDER — METHYLPREDNISOLONE (PAK) 4 MG PO TABS: ORAL_TABLET | ORAL | Status: DC

## 2014-10-31 MED ORDER — METHYLPREDNISOLONE SODIUM SUCC 125 MG IJ SOLR
125.0000 mg | Freq: Once | INTRAMUSCULAR | Status: AC
  Administered 2014-10-31: 125 mg via INTRAMUSCULAR

## 2014-10-31 NOTE — Patient Instructions (Signed)
  Take zyrtec once daily and continue taking this even when better (unless we tell you to stop prior to any allergy testing, if we end up going that route). Also use benadryl, as needed for any swelling. Today I recommend that you take zantac 150mg  twice daily. You have been given a steroid injection today, and it is important that you take the oral steroid course as directed as well. Start the oral steroids tomorrow morning.  We will be in touch with your lab results, to determine what the next step is. Go to the emergency room if you are having trouble swallowing, breathing

## 2014-10-31 NOTE — Progress Notes (Signed)
Chief Complaint  Patient presents with  . Angioedema    when he woke this morning the left side of his face was swollen and right hand.    Last night he noted mild lip swelling, on the left side. This morning he woke up with his left cheek and lip swollen, along with right hand swelling, extending up into the forearm.  Denies any itching or rash, no hives with any of the recent episodes of swelling.  He has had intermittent problems since 10/22.  He saw dermatologist 10/23 with left arm swelling, along with rash (the rash had been ongoing for a while, the swelling was new). He also had some mild lip swelling that day. He was put on steroids (a shot, and prednisone course). Per computer, it looks like he was treated with keflex and terbinafine.   Swelling and rash of the left arm resolved.  He then developed scrotal swelling afternoon of 11/6, along with lip swelling (lip swelling had improved mostly before eval in ER, per pt).  Went to ER and is being treated for a cellulitis with clindamycin.  By 11/8 the scrotal swelling had resolved.  He continues to take the antibiotic without any side effects (no diarrhea).  No new products, foods, exposures.  Past Medical History  Diagnosis Date  . Smoker    Past Surgical History  Procedure Laterality Date  . Tonsillectomy  age 48   History   Social History  . Marital Status: Married    Spouse Name: N/A    Number of Children: N/A  . Years of Education: N/A   Occupational History  . Not on file.   Social History Main Topics  . Smoking status: Current Every Day Smoker -- 1.00 packs/day    Types: Cigarettes  . Smokeless tobacco: Never Used  . Alcohol Use: 0.6 oz/week    1 Shots of liquor per week     Comment: maybe once a week  . Drug Use: No  . Sexual Activity: Yes   Other Topics Concern  . Not on file   Social History Narrative   Works in [employer], is a Hospital doctordriver.   Married, 2 kids and a dog   Family History  Problem Relation Age  of Onset  . Cancer Father     Leukemia  . Migraines Mother   . COPD Maternal Grandmother   . Cancer Paternal Grandfather   . Diabetes Neg Hx     Outpatient Encounter Prescriptions as of 10/31/2014  Medication Sig  . clindamycin (CLEOCIN) 150 MG capsule Take 3 capsules (450 mg total) by mouth 3 (three) times daily.  . diphenhydrAMINE (BENADRYL) 25 MG tablet Take 25 mg by mouth every 6 (six) hours as needed.  . Halcinonide (HALOG) 0.1 % CREA Apply 1 application topically 2 (two) times daily.   He took a benadryl this morning at 7:45.  He didn't take anything last night. He took Motrin yesterday evening for a headache (he reports that lip was swollen some before he took it).  No Known Allergies He reports some allergy to sun (gets a rash)  ROS:  No fever, chills, URI symptoms, chest pain, cough, shortness of breath, joint pains, nausea, vomiting. No bleeding, bruising, rash.  PHYSICAL EXAM: BP 124/70 mmHg  Pulse 92  Temp(Src) 97 F (36.1 C)  Ht 5\' 10"  (1.778 m)  Wt 194 lb (87.998 kg)  BMI 27.84 kg/m2  Pleasant male, with obvious left facial swelling, in no distress.  Speaking easily.  HEENT:  PERRL, EOMI, conjunctiva clear.  Left cheek with soft tissue swelling.  No mass or induration.  Soft tissue swelling extends into entire upper and lower lip (not the actual lip, but above and below).  Mucus membranes appear normal.  OP is clear.  No lymphadenopathy, thyromegaly or mass Heart: regular rate and rhythm Lungs: clear bilaterally Extremities: Right hand--diffusely swollen in central portion (dorsum and palmar).  Joints not swollen. No erythema, but slight increased warmth noted.2+ pulse, brisk capillary refill.  ASSESSMENT/PLAN:  Left facial swelling - ? angioedema vs other underlyng cause.  Suspect other cause given intermittent swelling of hands, cheek, and different area of lips  Angioedema, initial encounter - Plan: Comprehensive metabolic panel, CBC with Differential,  C-reactive protein, Sedimentation rate, C4 complement, methylPREDNIsolone (MEDROL DOSPACK) 4 MG tablet, methylPREDNISolone sodium succinate (SOLU-MEDROL) 125 mg/2 mL injection 125 mg, ANA, CANCELED: ANA w/Reflex if Positive  Swelling of hand joint, right - lab eval to work up underlying cause. May need to see allergist vs rheumatologist,depending on results, if ongoing problems - Plan: methylPREDNISolone sodium succinate (SOLU-MEDROL) 125 mg/2 mL injection 125 mg, ANA   Solu-medrol 125mg  IM, medrol dosepak, zyrtec and zantac, along with benadryl prn.   Take zyrtec once daily and continue taking this even when better (unless we tell you to stop prior to any allergy testing, if we end up going that route). Also use benadryl, as needed for any swelling. Today I recommend that you take zantac 150mg  twice daily. You have been given a steroid injection today, and it is important that you take the oral steroid course as directed as well. Start the oral steroids tomorrow morning.  We will be in touch with your lab results, to determine what the next step is. Go to the emergency room if you are having trouble swallowing, breathing

## 2014-11-01 LAB — C4 COMPLEMENT: C4 COMPLEMENT: 23 mg/dL (ref 10–40)

## 2014-11-01 LAB — SEDIMENTATION RATE: Sed Rate: 1 mm/hr (ref 0–16)

## 2014-11-01 LAB — ANA: ANA: NEGATIVE

## 2014-11-01 NOTE — Progress Notes (Signed)
   Subjective:    Patient ID: Thomas Day, male    DOB: 02/28/1966, 48 y.o.   MRN: [id number]  HPI    Review of Systems     Objective:   Physical Exam        Assessment & Plan:  His blood work came back negative although the a and is not here yet. He now notes that there were occasions when he felt as if he had whelps under his skin with intense itching. This did not occur with each bout of the angioedema that he had. He will continue on his cetirizine and if difficulty, call me. May possibly refer to an allergist.

## 2014-11-24 ENCOUNTER — Other Ambulatory Visit: Payer: Self-pay | Admitting: Family Medicine

## 2014-11-26 ENCOUNTER — Other Ambulatory Visit: Payer: Self-pay

## 2014-11-26 NOTE — Telephone Encounter (Signed)
Have him come and see me

## 2014-11-26 NOTE — Telephone Encounter (Signed)
Called patient and he is requesting refill on prednisone-had some facial swelling this past weekend. York SpanielSaid he was told by you to take 2 daily in the recent past.

## 2014-11-30 ENCOUNTER — Ambulatory Visit (INDEPENDENT_AMBULATORY_CARE_PROVIDER_SITE_OTHER): Payer: Managed Care, Other (non HMO) | Admitting: Family Medicine

## 2014-11-30 DIAGNOSIS — T783XXD Angioneurotic edema, subsequent encounter: Secondary | ICD-10-CM

## 2014-11-30 NOTE — Patient Instructions (Signed)
Right now with the vigilant. If the itching and swelling occur again go ahead and start the Zyrtec twice per day and Tagamet twice per day and call me. There is any question about swelling of the lips or tongue,go to the hospital. DO  NOT call me. If continued difficulty, hunger to into see an allergist

## 2014-11-30 NOTE — Progress Notes (Signed)
   Subjective:    Patient ID: Thomas Day, male    DOB: 10/11/1966, 48 y.o.   MRN: [id number]  HPI He is here for recheck. He was seen approximately one month ago and given an injection as well as steroid dose pack. He ended up taking the dose pack on a daily basis rather than tapering it. He has had intermittent outbreaks since last being seen. He did take one Tagamet per day and then increased that to twice a day dosing and when he would have a breakthrough he would use Benadryl. He never did take the Zyrtec. He states that he thought the prescription was actually Zyrtec when it was the steroid .He states that since stopping the steroid he does feel better. He states he felt kind of out of sorts. Since being off his medications, he states he is actually better but still is having a rash mainly on his lower legs.   Review of Systems     Objective:   Physical Exam Alert and in no distress. No facial swelling is noted. He does have slight patchy erythema on the anterior shins bilaterally. Laboratory data was reviewed      Assessment & Plan:  Angioedema, subsequent encounter  in spite of him not taking adequate doses of either an H2 or H1 blocker, he has gotten much better. I did reinforce the fact that if he has another attack, he is to use Zyrtec twice per day as well as Tagamet twice per day and go to the ER if there is any question of swelling of the tongue, lips or trouble breathing. If he has continued difficulty, referral to allergist will be made.

## 2014-12-10 ENCOUNTER — Telehealth: Payer: Self-pay | Admitting: Family Medicine

## 2014-12-10 NOTE — Telephone Encounter (Signed)
PT left message he needs referral to allergist

## 2014-12-11 NOTE — Telephone Encounter (Signed)
Pt has an appt with Dr. Eileen StanfordMeg Whelan at [facility] on Tuesday January 5th @ 8:45am. Address to this location is [address] [place], [place] [id number]. Phone # [phone]. Pt is to be off any anithistamine 3 days prior.  Pt was notified about his appt

## 2014-12-21 ENCOUNTER — Emergency Department (HOSPITAL_COMMUNITY)
Admission: EM | Admit: 2014-12-21 | Discharge: 2014-12-21 | Disposition: A | Discharge status: Home / Self Care | Payer: BC Managed Care – PPO | Attending: Emergency Medicine | Admitting: Emergency Medicine

## 2014-12-21 ENCOUNTER — Encounter (HOSPITAL_COMMUNITY): Payer: Self-pay | Admitting: Emergency Medicine

## 2014-12-21 DIAGNOSIS — T783XXD Angioneurotic edema, subsequent encounter: Secondary | ICD-10-CM | POA: Diagnosis not present

## 2014-12-21 DIAGNOSIS — X58XXXD Exposure to other specified factors, subsequent encounter: Secondary | ICD-10-CM | POA: Insufficient documentation

## 2014-12-21 DIAGNOSIS — Z720 Tobacco use: Secondary | ICD-10-CM | POA: Diagnosis not present

## 2014-12-21 DIAGNOSIS — R2241 Localized swelling, mass and lump, right lower limb: Secondary | ICD-10-CM | POA: Diagnosis present

## 2014-12-21 LAB — I-STAT CHEM 8, ED
BUN: 10 mg/dL (ref 6–23)
CALCIUM ION: 1.2 mmol/L (ref 1.12–1.23)
CREATININE: 0.8 mg/dL (ref 0.50–1.35)
Chloride: 98 mEq/L (ref 96–112)
Glucose, Bld: 99 mg/dL (ref 70–99)
HEMATOCRIT: 52 % (ref 39.0–52.0)
HEMOGLOBIN: 17.7 g/dL — AB (ref 13.0–17.0)
POTASSIUM: 3.9 mmol/L (ref 3.5–5.1)
Sodium: 137 mmol/L (ref 135–145)
TCO2: 26 mmol/L (ref 0–100)

## 2014-12-21 LAB — BASIC METABOLIC PANEL
ANION GAP: 7 (ref 5–15)
BUN: 11 mg/dL (ref 6–23)
CALCIUM: 9.3 mg/dL (ref 8.4–10.5)
CO2: 30 mmol/L (ref 19–32)
Chloride: 100 mEq/L (ref 96–112)
Creatinine, Ser: 0.9 mg/dL (ref 0.50–1.35)
GFR calc Af Amer: >90 mL/min (ref >90)
GFR calc non Af Amer: >90 mL/min (ref >90)
Glucose, Bld: 99 mg/dL (ref 70–99)
Potassium: 3.9 mmol/L (ref 3.5–5.1)
SODIUM: 137 mmol/L (ref 135–145)

## 2014-12-21 LAB — CBC WITH DIFFERENTIAL/PLATELET
Basophils Absolute: 0 10*3/uL (ref 0.0–0.1)
Basophils Relative: 0 % (ref 0–1)
Eosinophils Absolute: 0.4 10*3/uL (ref 0.0–0.7)
Eosinophils Relative: 5 % (ref 0–5)
HEMATOCRIT: 48.4 % (ref 39.0–52.0)
Hemoglobin: 15.9 g/dL (ref 13.0–17.0)
Lymphocytes Relative: 31 % (ref 12–46)
Lymphs Abs: 2.7 10*3/uL (ref 0.7–4.0)
MCH: 28.9 pg (ref 26.0–34.0)
MCHC: 32.9 g/dL (ref 30.0–36.0)
MCV: 87.8 fL (ref 78.0–100.0)
MONOS PCT: 9 % (ref 3–12)
Monocytes Absolute: 0.8 10*3/uL (ref 0.1–1.0)
NEUTROS PCT: 55 % (ref 43–77)
Neutro Abs: 5 10*3/uL (ref 1.7–7.7)
Platelets: 333 10*3/uL (ref 150–400)
RBC: 5.51 MIL/uL (ref 4.22–5.81)
RDW: 13.5 % (ref 11.5–15.5)
WBC: 8.9 10*3/uL (ref 4.0–10.5)

## 2014-12-21 MED ORDER — SODIUM CHLORIDE 0.9 % IV BOLUS (SEPSIS)
1000.0000 mL | Freq: Once | INTRAVENOUS | Status: AC
  Administered 2014-12-21: 1000 mL via INTRAVENOUS

## 2014-12-21 MED ORDER — METHYLPREDNISOLONE SODIUM SUCC 125 MG IJ SOLR
125.0000 mg | Freq: Once | INTRAMUSCULAR | Status: AC
  Administered 2014-12-21: 125 mg via INTRAVENOUS
  Filled 2014-12-21: qty 2

## 2014-12-21 MED ORDER — FAMOTIDINE 40 MG PO TABS: 40.0000 mg | ORAL_TABLET | Freq: Every day | ORAL | Status: DC

## 2014-12-21 MED ORDER — PREDNISONE 50 MG PO TABS: ORAL_TABLET | ORAL | Status: DC

## 2014-12-21 MED ORDER — FAMOTIDINE IN NACL 20-0.9 MG/50ML-% IV SOLN
20.0000 mg | Freq: Once | INTRAVENOUS | Status: AC
  Administered 2014-12-21: 20 mg via INTRAVENOUS
  Filled 2014-12-21: qty 50

## 2014-12-21 MED ORDER — DIPHENHYDRAMINE HCL 25 MG PO TABS: 50.0000 mg | ORAL_TABLET | ORAL | Status: DC | PRN

## 2014-12-21 MED ORDER — DIPHENHYDRAMINE HCL 50 MG/ML IJ SOLN
50.0000 mg | Freq: Once | INTRAMUSCULAR | Status: AC
  Administered 2014-12-21: 50 mg via INTRAVENOUS
  Filled 2014-12-21: qty 1

## 2014-12-21 NOTE — Discharge Instructions (Signed)
Do not hesitate to return to the Emergency Department for any new, worsening or concerning symptoms.  Call 911 if you start Drooling or have any shortness of breath  Please follow with your primary care doctor in the next 2 days for a check-up. They must obtain records for further management.    Angioedema Angioedema is a sudden swelling of tissues, often of the skin. It can occur on the face or genitals or in the abdomen or other body parts. The swelling usually develops over a short period and gets better in 24 to 48 hours. It often begins during the night and is found when the person wakes up. The person may also get red, itchy patches of skin (hives). Angioedema can be dangerous if it involves swelling of the air passages.  Depending on the cause, episodes of angioedema may only happen once, come back in unpredictable patterns, or repeat for several years and then gradually fade away.  CAUSES  Angioedema can be caused by an allergic reaction to various triggers. It can also result from nonallergic causes, including reactions to drugs, immune system disorders, viral infections, or an abnormal gene that is passed to you from your parents (hereditary). For some people with angioedema, the cause is unknown.  Some things that can trigger angioedema include:   Foods.   Medicines, such as ACE inhibitors, ARBs, nonsteroidal anti-inflammatory agents, or estrogen.   Latex.   Animal saliva.   Insect stings.   Dyes used in X-rays.   Mild injury.   Dental work.  Surgery.  Stress.   Sudden changes in temperature.   Exercise. SIGNS AND SYMPTOMS   Swelling of the skin.  Hives. If these are present, there is also intense itching.  Redness in the affected area.   Pain in the affected area.  Swollen lips or tongue.  Breathing problems. This may happen if the air passages swell.  Wheezing. If internal organs are involved, there may be:   Nausea.   Abdominal pain.    Vomiting.   Difficulty swallowing.   Difficulty passing urine. DIAGNOSIS   Your health care provider will examine the affected area and take a medical and family history.  Various tests may be done to help determine the cause. Tests may include:  Allergy skin tests to see if the problem is an allergic reaction.   Blood tests to check for hereditary angioedema.   Tests to check for underlying diseases that could cause the condition.   A review of your medicines, including over-the-counter medicines, may be done. TREATMENT  Treatment will depend on the cause of the angioedema. Possible treatments include:   Removal of anything that triggered the condition (such as stopping certain medicines).   Medicines to treat symptoms or prevent attacks. Medicines given may include:   Antihistamines.   Epinephrine injection.   Steroids.   Hospitalization may be required for severe attacks. If the air passages are affected, it can be an emergency. Tubes may need to be placed to keep the airway open. HOME CARE INSTRUCTIONS   Take all medicines as directed by your health care provider.  If you were given medicines for emergency allergy treatment, always carry them with you.  Wear a medical bracelet as directed by your health care provider.   Avoid known triggers. SEEK MEDICAL CARE IF:   You have repeat attacks of angioedema.   Your attacks are more frequent or more severe despite preventive measures.   You have hereditary angioedema and are considering  having children. It is important to discuss with your health care provider the risks of passing the condition on to your children. SEEK IMMEDIATE MEDICAL CARE IF:   You have severe swelling of the mouth, tongue, or lips.  You have difficulty breathing.   You have difficulty swallowing.   You faint. MAKE SURE YOU:  Understand these instructions.  Will watch your condition.  Will get help right away if you  are not doing well or get worse. Document Released: 02/15/2002 Document Revised: 04/23/2014 Document Reviewed: 07/31/2013 [facility] Patient Information 2015 [place], [place]. This information is not intended to replace advice given to you by your health care provider. Make sure you discuss any questions you have with your health care provider.

## 2014-12-21 NOTE — ED Provider Notes (Signed)
CSN: [id number]     Arrival date & time 12/21/14  4098 History   First MD Initiated Contact with Patient 12/21/14 [phone]     Chief Complaint  Patient presents with  . Oral Swelling  . Foot Swelling     (Consider location/radiation/quality/duration/timing/severity/associated sxs/prior Treatment) HPI   NAEL PETROSYAN is a 49 y.o. male complaining of tongue swelling and right foot swelling onset this morning at 8 AM. Patient states that he started out on the right side of the tongue but it is since enlarged. Patient has these episodes and they spontaneously resolve. He is not taking any ACE inhibitor's, patient does not take any regular medications, no new environmental exposures. Patient denies any shortness of breath but states that it's difficult to swallow however he is not drooling. He denies fever, chills. Patient states when he went to bed last night he was in his normal state of health. States that he has had these episodes in the past, it affects his feet, hands, scrotum and half of the tongue normally his never been the whole tongue.  Past Medical History  Diagnosis Date  . Smoker    Past Surgical History  Procedure Laterality Date  . Tonsillectomy  age 57   Family History  Problem Relation Age of Onset  . Cancer Father     Leukemia  . Migraines Mother   . COPD Maternal Grandmother   . Cancer Paternal Grandfather   . Diabetes Neg Hx    History  Substance Use Topics  . Smoking status: Current Every Day Smoker -- 1.00 packs/day    Types: Cigarettes  . Smokeless tobacco: Never Used  . Alcohol Use: 0.6 oz/week    1 Shots of liquor per week     Comment: maybe once a week    Review of Systems  10 systems reviewed and found to be negative, except as noted in the HPI.   Allergies  Review of patient's allergies indicates no known allergies.  Home Medications   Prior to Admission medications   Medication Sig Start Date End Date Taking? Authorizing Provider   diphenhydrAMINE (BENADRYL) 25 MG tablet Take 2 tablets (50 mg total) by mouth every 4 (four) hours as needed for itching. 12/21/14   Eryn Krejci, PA-C  famotidine (PEPCID) 40 MG tablet Take 1 tablet (40 mg total) by mouth daily. 12/21/14   Wilbern Pennypacker, PA-C  predniSONE (DELTASONE) 50 MG tablet Take 1 tablet daily with breakfast 12/21/14   Joni Reining Donni Oglesby, PA-C   BP 119/74 mmHg  Pulse 81  Temp(Src) 97.7 F (36.5 C) (Oral)  Resp 18  SpO2 97% Physical Exam  Constitutional: He is oriented to person, place, and time. He appears well-developed and well-nourished. No distress.  HENT:  Head: Normocephalic and atraumatic.  Mouth/Throat: Oropharynx is clear and moist.  Mild to moderate tongue swelling, tongue is elevated off the floor of the mouth. Patient is handling his secretions without issue, there is no stridor, lung sounds are clear to auscultation bilaterally. Patient is reclining in speaking in complete sentences, there is a change to voice.  Eyes: Conjunctivae and EOM are normal. Pupils are equal, round, and reactive to light.  Cardiovascular: Normal rate, regular rhythm and intact distal pulses.   Pulmonary/Chest: Effort normal and breath sounds normal. No stridor. No respiratory distress. He has no wheezes. He has no rales. He exhibits no tenderness.  Abdominal: Soft. Bowel sounds are normal. He exhibits no distension and no mass. There is no tenderness. There is  no rebound and no guarding.  Musculoskeletal: Normal range of motion. He exhibits edema.  She also has a small amount of erythema and edema to sole of right foot. No significant tenderness palpation.  Neurological: He is alert and oriented to person, place, and time.  Psychiatric: He has a normal mood and affect.  Nursing note and vitals reviewed.   ED Course  Procedures (including critical care time) Labs Review Labs Reviewed  I-STAT CHEM 8, ED - Abnormal; Notable for the following:    Hemoglobin 17.7 (*)    All  other components within normal limits  CBC WITH DIFFERENTIAL  BASIC METABOLIC PANEL    Imaging Review No results found.   EKG Interpretation None      10:50 AM: Patient reassessed after medications, no significant change in symptoms. Tolerating his secretions well, lung sounds remain clear to auscultation, there may be a slight improvement in the tongue swelling.  MDM   Final diagnoses:  Angioedema, subsequent encounter    Filed Vitals:   12/21/14 0908 12/21/14 1214  BP: 139/91 119/74  Pulse: 81   Temp: 97.5 F (36.4 C) 97.7 F (36.5 C)  TempSrc: Oral Oral  Resp: 18   SpO2: 100% 97%    Medications  sodium chloride 0.9 % bolus 1,000 mL (1,000 mLs Intravenous New Bag/Given 12/21/14 1005)  diphenhydrAMINE (BENADRYL) injection 50 mg (50 mg Intravenous Given 12/21/14 1005)  methylPREDNISolone sodium succinate (SOLU-MEDROL) 125 mg/2 mL injection 125 mg (125 mg Intravenous Given 12/21/14 1005)  famotidine (PEPCID) IVPB 20 mg (0 mg Intravenous Stopped 12/21/14 1032)    Zamari P Krapf is a pleasant 49 y.o. male presenting with recurrent angioedema, airway is not threatened at this time. Patient is not taking any ACE inhibitor is, he has not had any new exposures or new meds. This is a recurrent issue for this patient. Patient will be given IV Benadryl, Pepcid, Solu-Medrol and fluid bolus. Patient is observed in the ED and there is a improvement in his symptoms. Patient has appointment with allergist on Monday, he lives 4 minutes away from the hospital, we've had an exquisite discussion of return precautions including calling 911 if he starts drooling or having any shortness of breath whatsoever.  This is a shared visit with the attending physician who personally evaluated the patient and agrees with the care plan.   Evaluation does not show pathology that would require ongoing emergent intervention or inpatient treatment. Pt is hemodynamically stable and mentating appropriately. Discussed  findings and plan with patient/guardian, who agrees with care plan. All questions answered. Return precautions discussed and outpatient follow up given.   New Prescriptions   DIPHENHYDRAMINE (BENADRYL) 25 MG TABLET    Take 2 tablets (50 mg total) by mouth every 4 (four) hours as needed for itching.   FAMOTIDINE (PEPCID) 40 MG TABLET    Take 1 tablet (40 mg total) by mouth daily.   PREDNISONE (DELTASONE) 50 MG TABLET    Take 1 tablet daily with breakfast         Wynetta Emery, PA-C 12/21/14 1313  Nelia Shi, MD 12/21/14 1318

## 2014-12-21 NOTE — ED Notes (Signed)
Pt states that his tongue and rt foot began to spontaneously swell about an hour ago.  Significant tongue swelling. Denies ACE inhibitors.  Denies any new foods or substances.  Pt states he cannot swallow, however, pt is maintaining secretions at this time.  Airway patent.  No SOB or throat compromise. Denies hives or itching.

## 2014-12-24 ENCOUNTER — Other Ambulatory Visit: Payer: Self-pay

## 2014-12-24 ENCOUNTER — Telehealth: Payer: Self-pay

## 2014-12-24 DIAGNOSIS — T783XXA Angioneurotic edema, initial encounter: Secondary | ICD-10-CM

## 2014-12-24 NOTE — Telephone Encounter (Signed)
Left message for pt on cell # he has an appointment with Dr.Whiling Monday Jan.11 at 1:45 no antihistamines 3 days prior to his visit

## 2014-12-31 ENCOUNTER — Telehealth: Payer: Self-pay

## 2014-12-31 NOTE — Telephone Encounter (Signed)
Dr.Lalonde I called Dr.Whelan's office talked with her nurse Marchelle Folksmanda she said they didn't receive the notes or labs and the Patient didn't say anything about having labs drawn so they have gone ahead and they have been drawn.

## 2015-02-13 ENCOUNTER — Ambulatory Visit (INDEPENDENT_AMBULATORY_CARE_PROVIDER_SITE_OTHER): Payer: BLUE CROSS/BLUE SHIELD | Admitting: Family Medicine

## 2015-02-13 ENCOUNTER — Encounter: Payer: Self-pay | Admitting: Family Medicine

## 2015-02-13 VITALS — BP 118/78 | HR 96 | Ht 69.5 in | Wt 191.0 lb

## 2015-02-13 DIAGNOSIS — Z720 Tobacco use: Secondary | ICD-10-CM

## 2015-02-13 DIAGNOSIS — E756 Lipid storage disorder, unspecified: Secondary | ICD-10-CM

## 2015-02-13 DIAGNOSIS — Z0000 Encounter for general adult medical examination without abnormal findings: Secondary | ICD-10-CM

## 2015-02-13 DIAGNOSIS — T781XXA Other adverse food reactions, not elsewhere classified, initial encounter: Secondary | ICD-10-CM | POA: Insufficient documentation

## 2015-02-13 DIAGNOSIS — E8809 Other disorders of plasma-protein metabolism, not elsewhere classified: Secondary | ICD-10-CM

## 2015-02-13 DIAGNOSIS — F17200 Nicotine dependence, unspecified, uncomplicated: Secondary | ICD-10-CM

## 2015-02-13 NOTE — Progress Notes (Signed)
   Subjective:    Patient ID: Thomas Day, male    DOB: 09/07/1966, 49 y.o.   MRN: [id number]  HPI He is here for complete examination. He was recently diagnosed with alpha gal and is being followed by immunology. He also is had difficulty with lesions on his legs and has seen dermatology in the past and given a log. This has had minimal benefit. He smokes and is considering quitting. He is starting to limit his access to cigarettes. His work and home life are going well. He does have 2 children. Family and social history as well as immunizations and health maintenance were reviewed.   Review of Systems  All other systems reviewed and are negative.      Objective:   Physical Exam BP 118/78 mmHg  Pulse 96  Ht 5' 9.5" (1.765 m)  Wt 191 lb (86.637 kg)  BMI 27.81 kg/m2  SpO2 97%  General Appearance:    Alert, cooperative, no distress, appears stated age  Head:    Normocephalic, without obvious abnormality, atraumatic  Eyes:    PERRL, conjunctiva/corneas clear, EOM's intact, fundi    benign  Ears:    Normal TM's and external ear canals  Nose:   Nares normal, mucosa normal, no drainage or sinus   tenderness  Throat:   Lips, mucosa, and tongue normal; teeth and gums normal  Neck:   Supple, no lymphadenopathy;  thyroid:  no   enlargement/tenderness/nodules; no carotid   bruit or JVD  Back:    Spine nontender, no curvature, ROM normal, no CVA     tenderness  Lungs:     Clear to auscultation bilaterally without wheezes, rales or     ronchi; respirations unlabored  Chest Wall:    No tenderness or deformity   Heart:    Regular rate and rhythm, S1 and S2 normal, no murmur, rub   or gallop  Breast Exam:    No chest wall tenderness, masses or gynecomastia  Abdomen:     Soft, non-tender, nondistended, normoactive bowel sounds,    no masses, no hepatosplenomegaly        Extremities:   No clubbing, cyanosis or edema  Pulses:   2+ and symmetric all extremities  Skin:   Skin color, texture,  turgor normal, no rashes or lesions  Lymph nodes:   Cervical, supraclavicular, and axillary nodes normal  Neurologic:   CNII-XII intact, normal strength, sensation and gait; reflexes 2+ and symmetric throughout          Psych:   Normal mood, affect, hygiene and grooming.          Assessment & Plan:  Current smoker  Alpha galactosidase deficiency  Routine general medical examination at a health care facility  He will continue to be followed by his allergist. Recommend he follow-up with his dermatologist concerning the lesions on his lower extremities. Also counseled him concerning smoking cessation and encouraged him to contact me if he wants to get further counseling concerning this.

## 2015-10-01 IMAGING — CR DG ANKLE COMPLETE 3+V*L*
3 series · 3 of 3 positions shown · non-contrast
Comparison: None.

CLINICAL DATA: Twisting injury left ankle.

EXAM:
LEFT ANKLE COMPLETE - 3+ VIEW

[x ankle ap left]
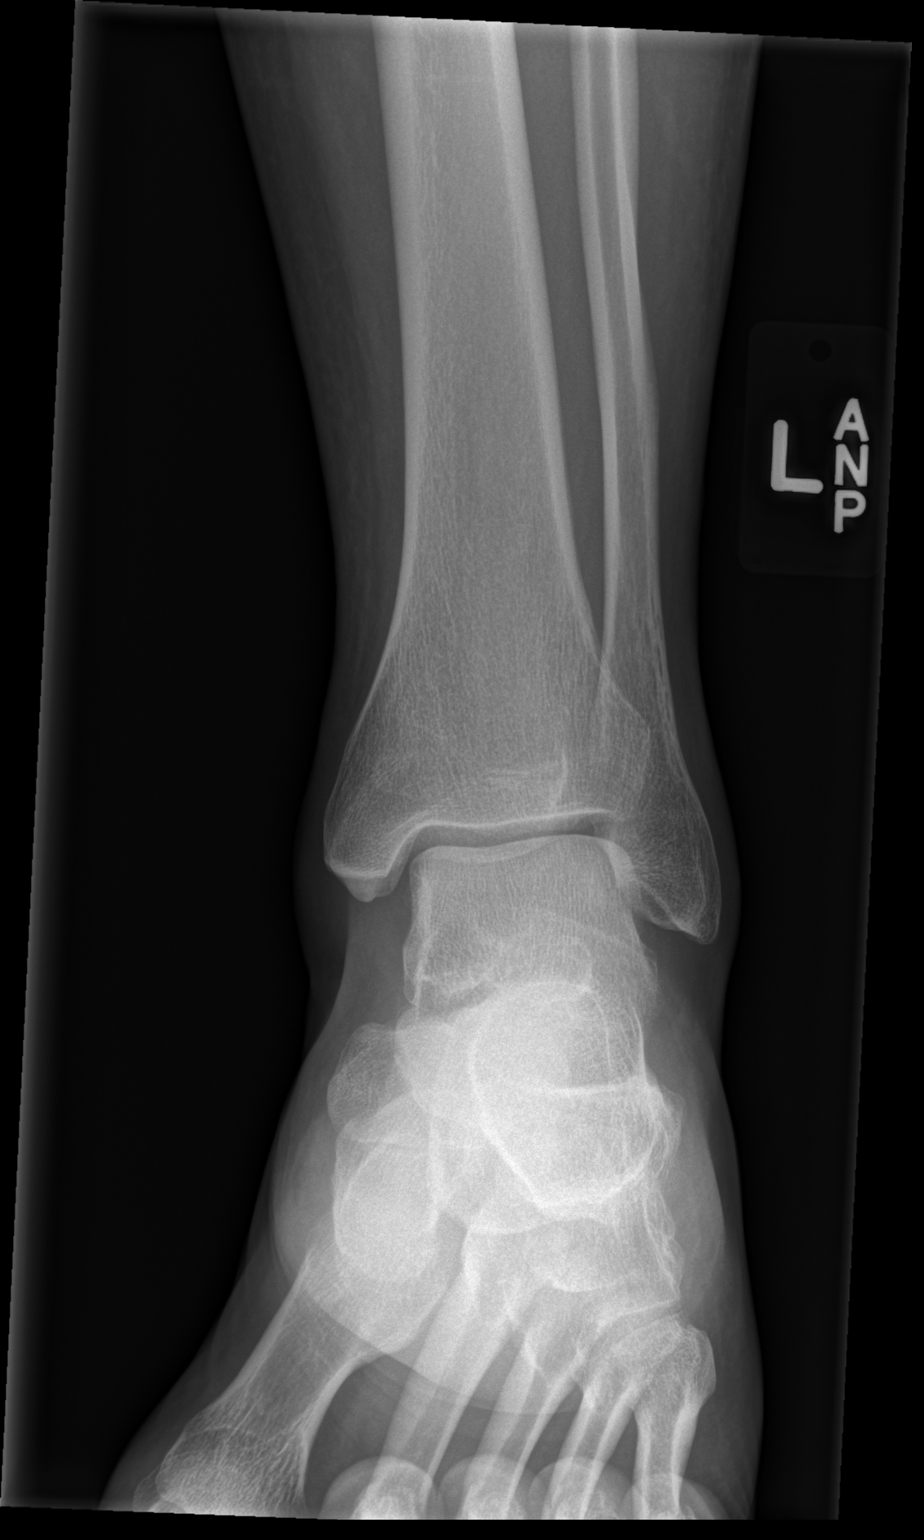

[x ankle obl left]
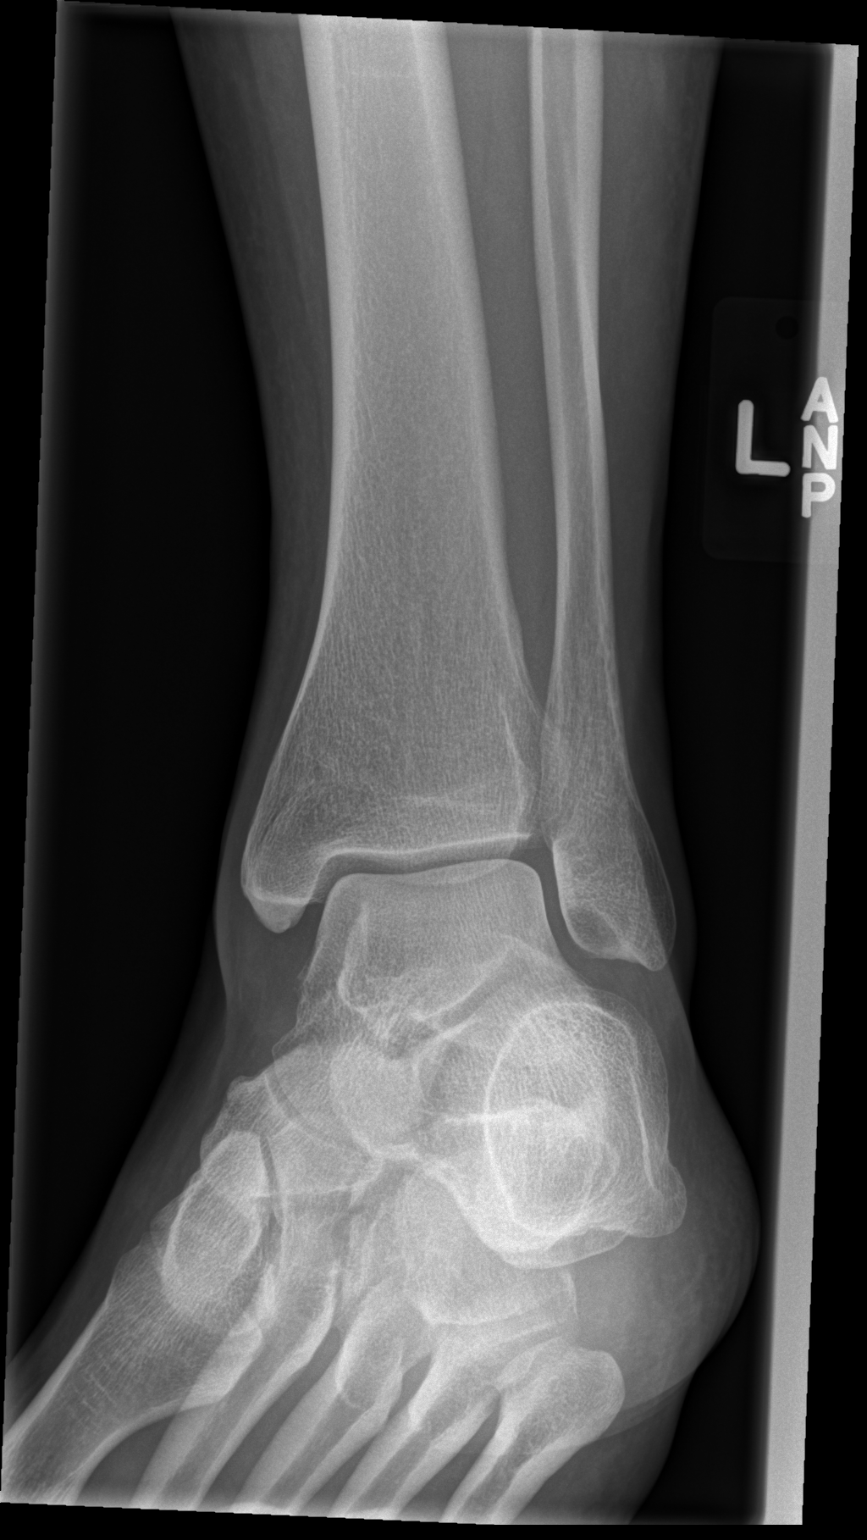

[x ankle lat left]
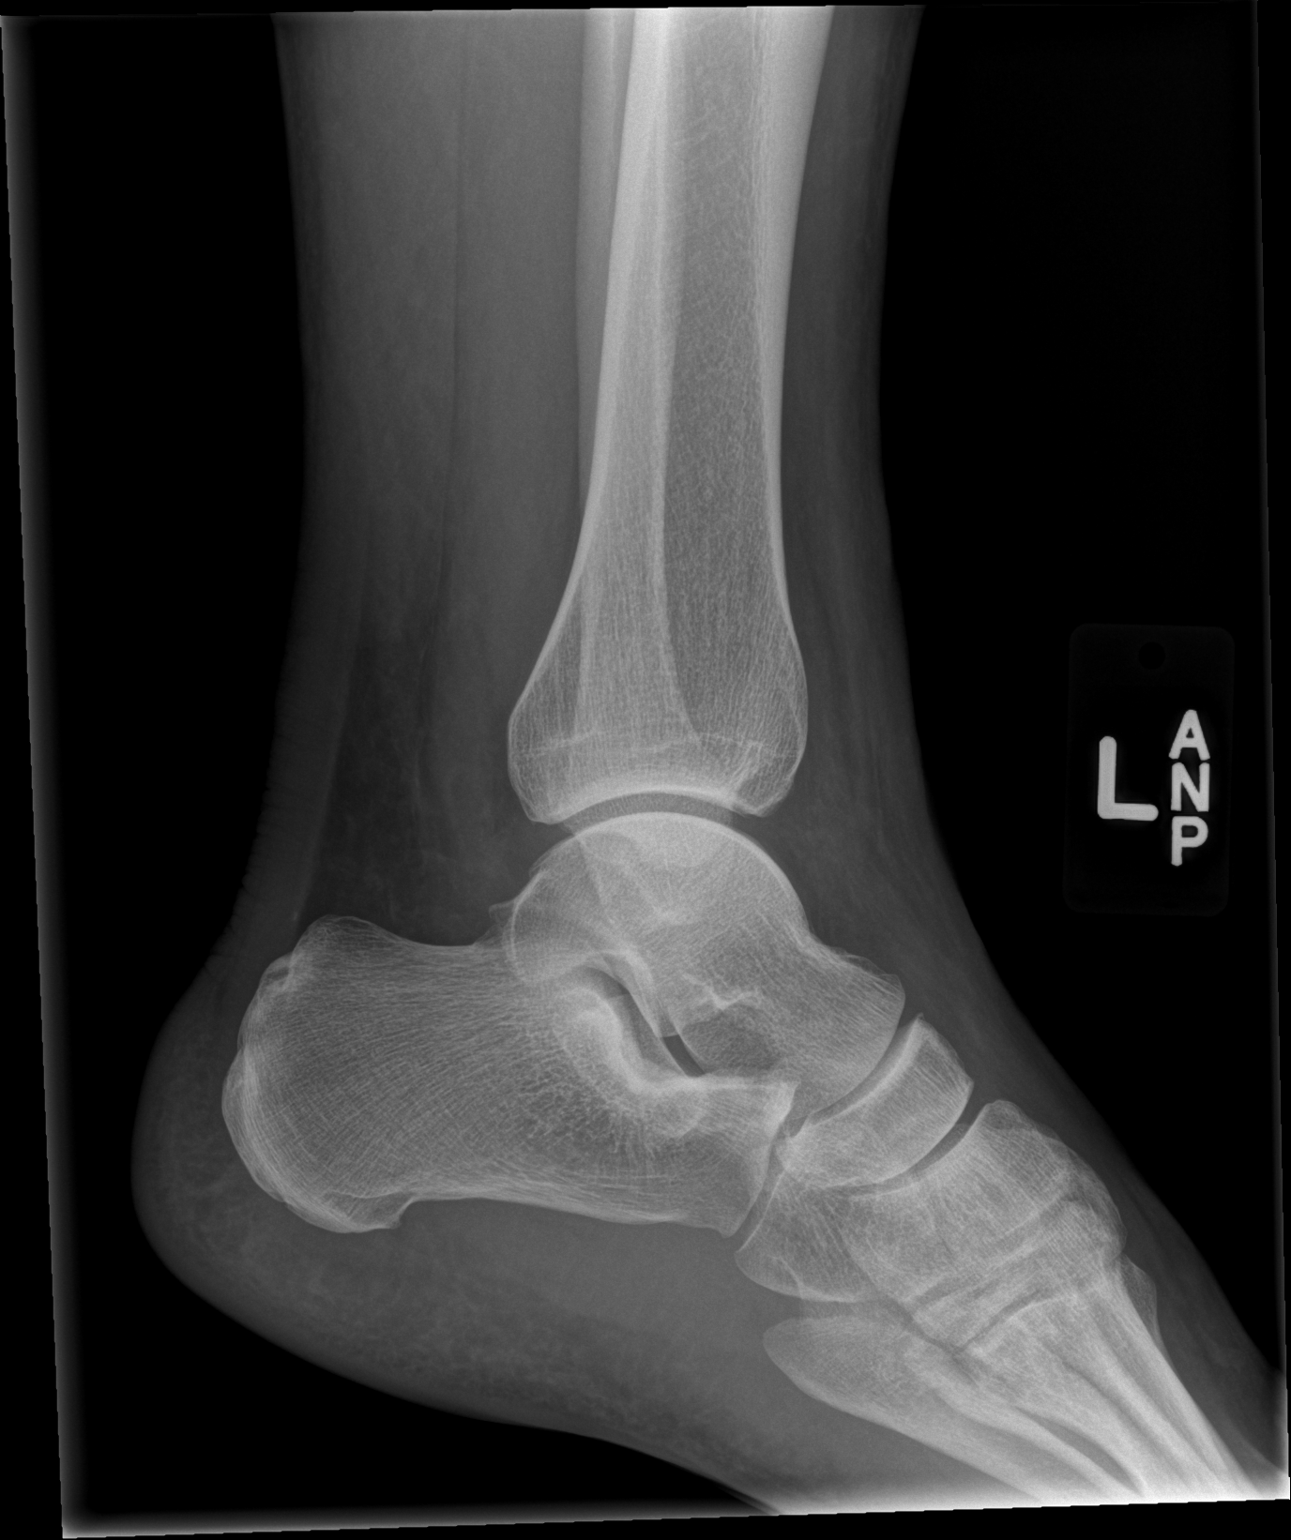

[3 of 3 positions shown; findings below may reference images not displayed]

FINDINGS: Imaged bones, joints and soft tissues appear normal.
IMPRESSION: Negative exam.

## 2016-01-28 IMAGING — US US SCROTUM
1 series · 13 of 25 positions shown · non-contrast
Comparison: None.

CLINICAL DATA: Sudden onset of scrotal swelling.

EXAM:
SCROTAL ULTRASOUND
DOPPLER ULTRASOUND OF THE TESTICLES
TECHNIQUE: Complete ultrasound examination of the testicles, epididymis, and
other scrotal structures was performed. Color and spectral Doppler
ultrasound were also utilized to evaluate blood flow to the
testicles.

[Series 1: us scrotum · 0.10mm/px · 13 of 60 slices shown]
[im 1/60]
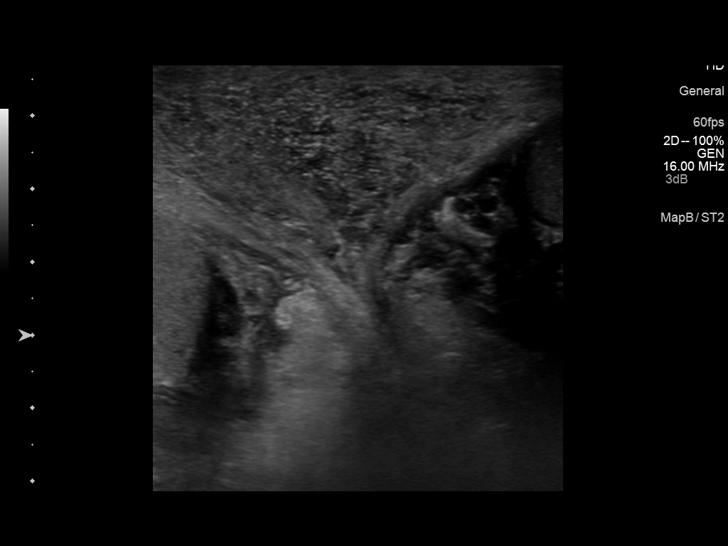
[im 5/60]
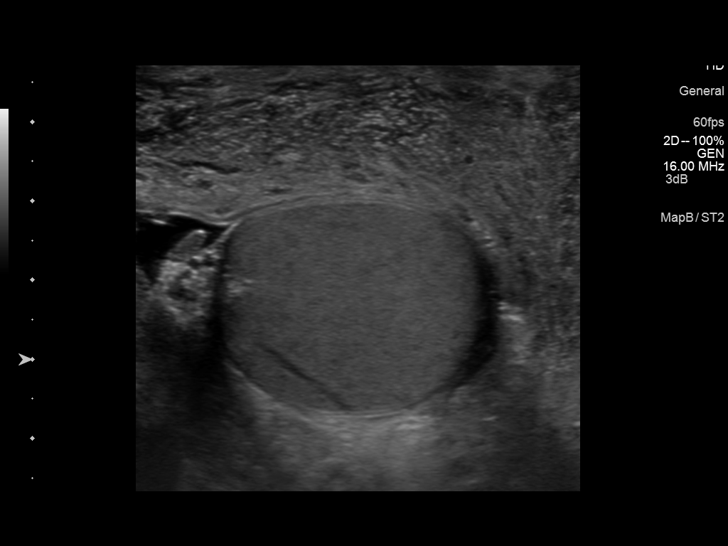
[im 10/60]
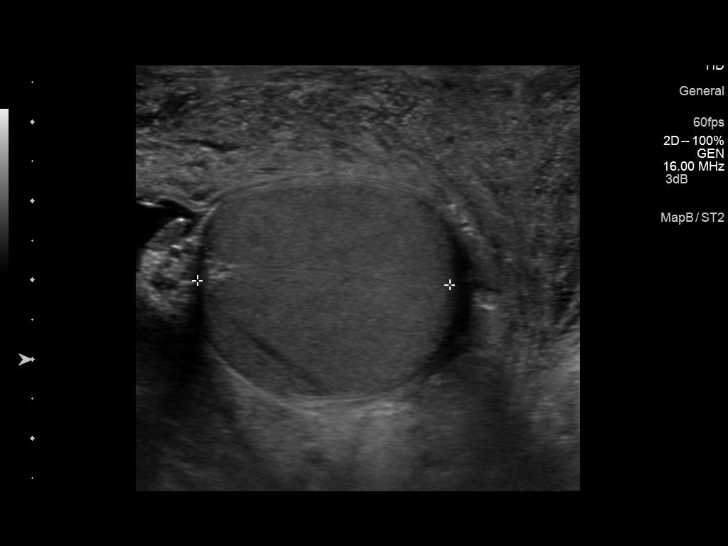
[im 15/60]
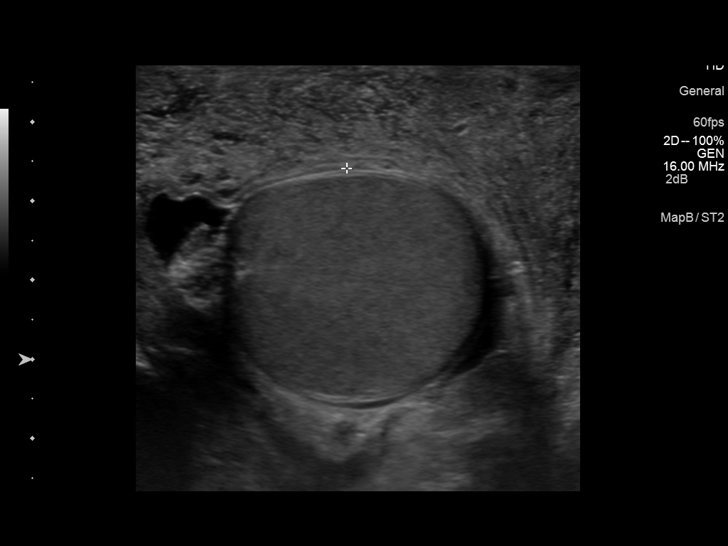
[im 20/60]
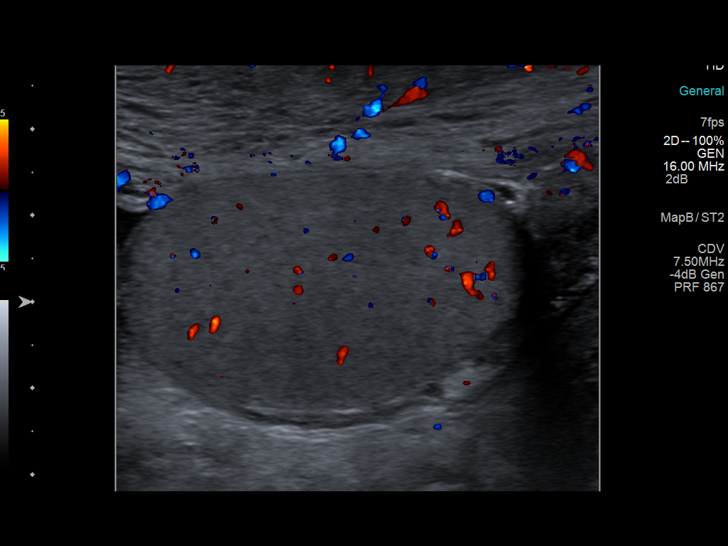
[im 25/60]
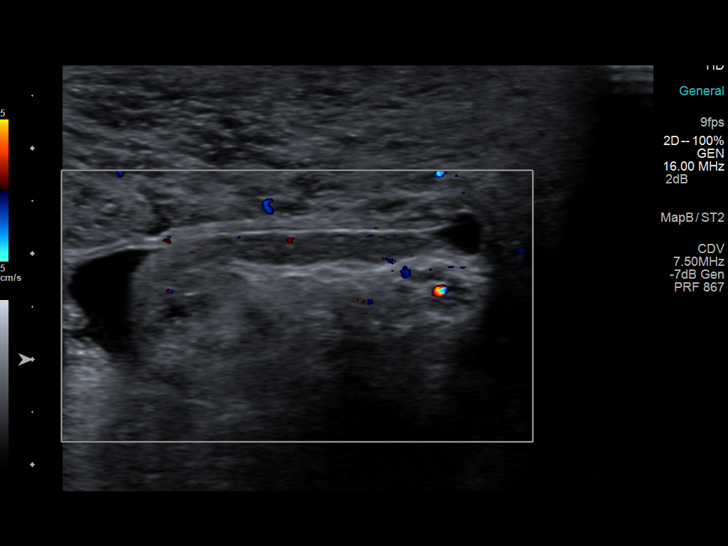
[im 30/60]
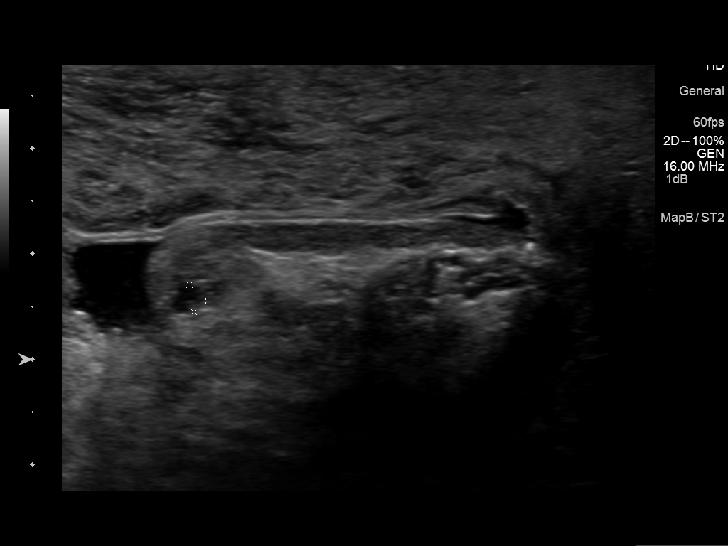
[im 35/60]
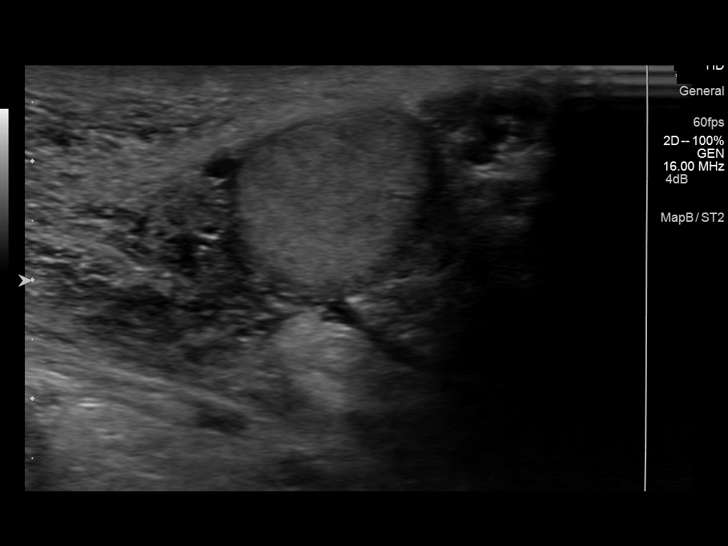
[im 40/60]
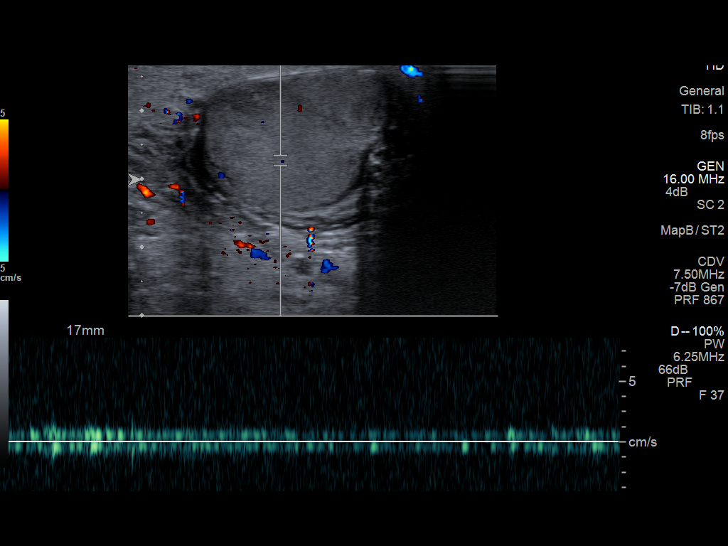
[im 45/60]
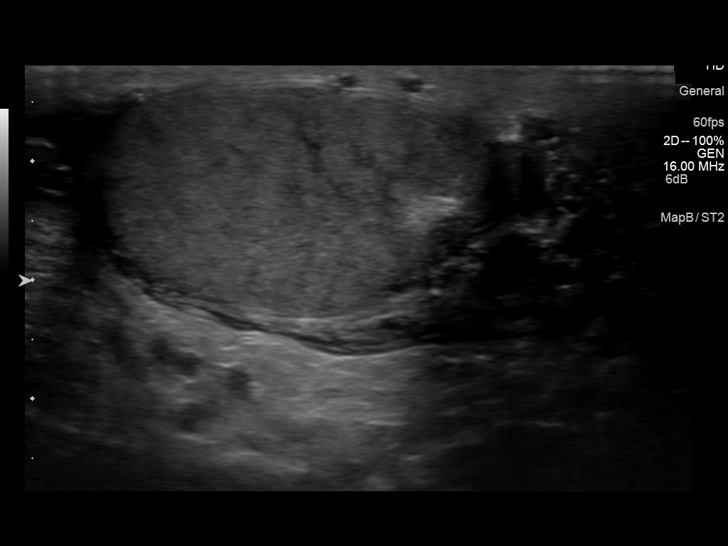
[im 50/60]
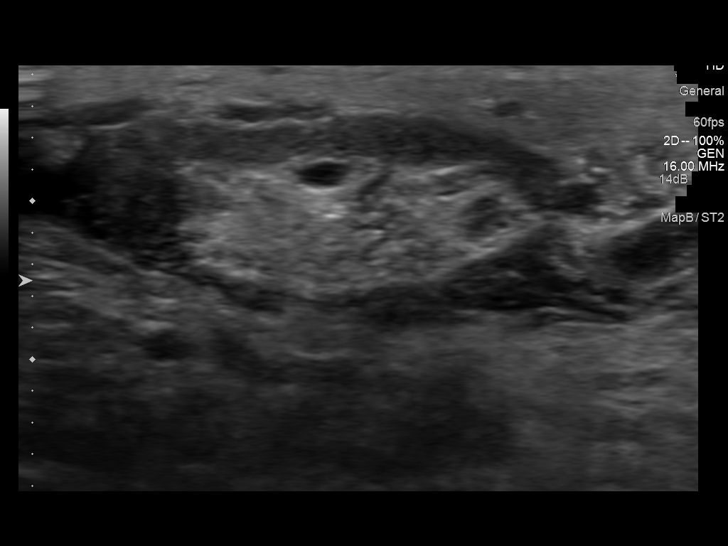
[im 55/60]
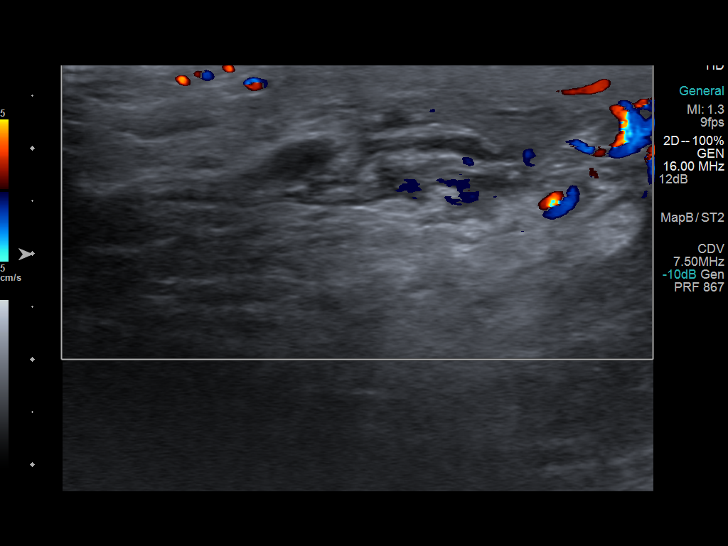
[im 60/60]
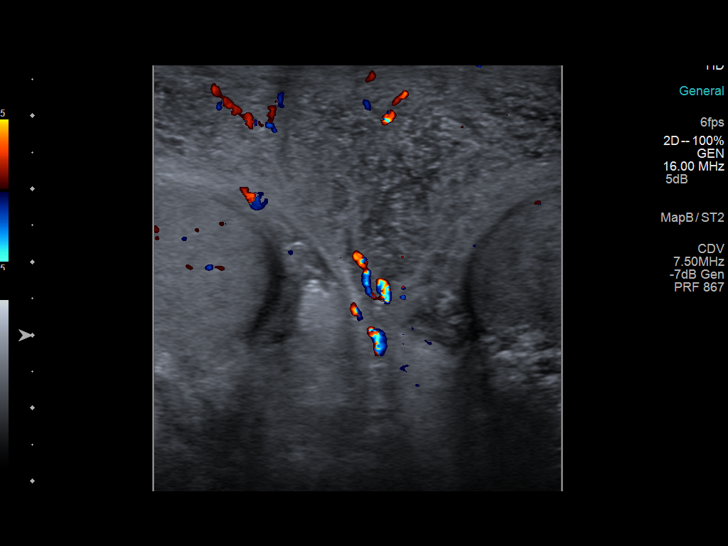

[13 of 25 positions shown; findings below may reference images not displayed]

FINDINGS: Right testicle

Measurements: 4.6 x 2.8 x 3.2 cm. No mass or microlithiasis
visualized.

Left testicle

Measurements: 3.7 x 2.1 x 2.6 cm. No mass or microlithiasis
visualized.

Right epididymis: Small right epididymal cyst or spermatocele
measuring about 4 mm diameter. Epididymis otherwise unremarkable.

Left epididymis:  Normal in size and appearance.

Hydrocele:  None visualized.

Varicocele:  None visualized.

Pulsed Doppler interrogation of both testes demonstrates low
resistance arterial and venous waveforms bilaterally. Normal
homogeneous flow is demonstrated in both testes and epididymides on
color flow Doppler imaging.

Extensive diffuse scrotal skin thickening and edema with increased
flow on color flow Doppler imaging suggesting cellulitis. No
discrete abscess identified.
IMPRESSION: Testicles are normal. No evidence of testicular mass or torsion.
Diffuse scrotal skin thickening and edema consistent with
cellulitis.

## 2018-06-19 DIAGNOSIS — R6884 Jaw pain: Secondary | ICD-10-CM | POA: Diagnosis not present

## 2018-11-24 DIAGNOSIS — T783XXA Angioneurotic edema, initial encounter: Secondary | ICD-10-CM | POA: Diagnosis not present

## 2018-11-24 DIAGNOSIS — J3081 Allergic rhinitis due to animal (cat) (dog) hair and dander: Secondary | ICD-10-CM | POA: Diagnosis not present

## 2018-11-24 DIAGNOSIS — Z91018 Allergy to other foods: Secondary | ICD-10-CM | POA: Diagnosis not present

## 2019-03-14 ENCOUNTER — Other Ambulatory Visit: Payer: Self-pay

## 2019-03-14 ENCOUNTER — Encounter: Payer: Self-pay | Admitting: Family Medicine

## 2019-03-14 ENCOUNTER — Ambulatory Visit: Payer: BLUE CROSS/BLUE SHIELD | Admitting: Family Medicine

## 2019-03-14 VITALS — BP 122/80 | HR 71 | Temp 98.2°F | Wt 168.8 lb

## 2019-03-14 DIAGNOSIS — K4090 Unilateral inguinal hernia, without obstruction or gangrene, not specified as recurrent: Secondary | ICD-10-CM | POA: Diagnosis not present

## 2019-03-14 NOTE — Progress Notes (Signed)
   Subjective:    Patient ID: Thomas Day, male    DOB: 03/16/1966, 53 y.o.   MRN: [id number]  HPI Last week after he coughed he noticed some right inguinal discomfort and several days after that noted swelling in the right inguinal area.   Review of Systems     Objective:   Physical Exam Alert and in no distress.  Exam of the inguinal area shows a hernia present.       Assessment & Plan:  Inguinal hernia of right side without obstruction or gangrene - Plan: Ambulatory referral to General Surgery I discussed treatment of the hernia and the fact that this is elective.  Explained that this will probably be done as an outpatient but will require several weeks of rehabilitation.  Also discussed the fact that this can cause an obstruction.  It is elective and we will set him up for an appointment.

## 2019-08-04 DIAGNOSIS — F4320 Adjustment disorder, unspecified: Secondary | ICD-10-CM | POA: Diagnosis not present

## 2019-12-22 HISTORY — PX: INGUINAL HERNIA REPAIR: SHX194

## 2020-03-13 ENCOUNTER — Encounter: Payer: Self-pay | Admitting: Family Medicine

## 2020-03-13 ENCOUNTER — Ambulatory Visit: Payer: BC Managed Care – PPO | Admitting: Family Medicine

## 2020-03-13 ENCOUNTER — Other Ambulatory Visit: Payer: Self-pay

## 2020-03-13 VITALS — BP 110/72 | HR 75 | Temp 97.5°F | Wt 161.8 lb

## 2020-03-13 DIAGNOSIS — R61 Generalized hyperhidrosis: Secondary | ICD-10-CM | POA: Diagnosis not present

## 2020-03-13 DIAGNOSIS — E756 Lipid storage disorder, unspecified: Secondary | ICD-10-CM

## 2020-03-13 DIAGNOSIS — Z1211 Encounter for screening for malignant neoplasm of colon: Secondary | ICD-10-CM | POA: Diagnosis not present

## 2020-03-13 DIAGNOSIS — F17200 Nicotine dependence, unspecified, uncomplicated: Secondary | ICD-10-CM

## 2020-03-13 DIAGNOSIS — Z0000 Encounter for general adult medical examination without abnormal findings: Secondary | ICD-10-CM

## 2020-03-13 DIAGNOSIS — E8809 Other disorders of plasma-protein metabolism, not elsewhere classified: Secondary | ICD-10-CM

## 2020-03-13 NOTE — Progress Notes (Signed)
   Subjective:    Patient ID: Thomas Day, male    DOB: 1966/01/27, 54 y.o.   MRN: [id number]  HPI He is here for complete examination.  He does have alpha gal deficiency.  He has done a very good job of researching various sites to find out the kinds of things that can trigger this including most uric acid that is a component of several injections.  He knows to avoid them.  He does complain of excessive sweating and this has been going on for several years.  He has had no fever, chills, cough, congestion, GI symptoms.  He does smoke and has cut back but has not completely stopped.  Otherwise he has no problems or concerns.  Family and social history as well as health maintenance and immunizations was reviewed.  His marriage is going fairly well.  He has 2 children.   Review of Systems  All other systems reviewed and are negative.      Objective:   Physical Exam Alert and in no distress. Tympanic membranes and canals are normal. Pharyngeal area is normal. Neck is supple without adenopathy or thyromegaly. Cardiac exam shows a regular sinus rhythm without murmurs or gallops. Lungs are clear to auscultation.  Abdominal exam shows no masses or tenderness.       Assessment & Plan:  Routine general medical examination at a health care facility - Plan: CBC with Differential/Platelet, Comprehensive metabolic panel, Lipid panel  Alpha galactosidase deficiency  Current smoker  Hyperhidrosis  Screening for colon cancer - Plan: Cologuard He has a good handle on how to handle the alpha gal.  Discussed smoking cessation with him.  Gave him some guidance on working on psychological component of that. Discussed the hyperhidrosis with him and at this point there does not seem to be a good reason behind this.  Discussed the possible use of medication however we will hold off on that and he was comfortable with that.

## 2020-03-14 LAB — CBC WITH DIFFERENTIAL/PLATELET
Basophils Absolute: 0.1 10*3/uL (ref 0.0–0.2)
Basos: 1 %
EOS (ABSOLUTE): 0.5 10*3/uL — ABNORMAL HIGH (ref 0.0–0.4)
Eos: 6 %
Hematocrit: 43.3 % (ref 37.5–51.0)
Hemoglobin: 14.8 g/dL (ref 13.0–17.7)
Immature Grans (Abs): 0 10*3/uL (ref 0.0–0.1)
Immature Granulocytes: 0 %
Lymphocytes Absolute: 2.5 10*3/uL (ref 0.7–3.1)
Lymphs: 33 %
MCH: 30.4 pg (ref 26.6–33.0)
MCHC: 34.2 g/dL (ref 31.5–35.7)
MCV: 89 fL (ref 79–97)
Monocytes Absolute: 0.7 10*3/uL (ref 0.1–0.9)
Monocytes: 9 %
Neutrophils Absolute: 3.7 10*3/uL (ref 1.4–7.0)
Neutrophils: 51 %
Platelets: 302 10*3/uL (ref 150–450)
RBC: 4.87 x10E6/uL (ref 4.14–5.80)
RDW: 12.7 % (ref 11.6–15.4)
WBC: 7.5 10*3/uL (ref 3.4–10.8)

## 2020-03-14 LAB — LIPID PANEL
Chol/HDL Ratio: 2.9 ratio (ref 0.0–5.0)
Cholesterol, Total: 187 mg/dL (ref 100–199)
HDL: 64 mg/dL (ref >39)
LDL Chol Calc (NIH): 105 mg/dL — ABNORMAL HIGH (ref 0–99)
Triglycerides: 102 mg/dL (ref 0–149)
VLDL Cholesterol Cal: 18 mg/dL (ref 5–40)

## 2020-03-14 LAB — COMPREHENSIVE METABOLIC PANEL
ALT: 22 IU/L (ref 0–44)
AST: 23 IU/L (ref 0–40)
Albumin/Globulin Ratio: 1.9 (ref 1.2–2.2)
Albumin: 4.5 g/dL (ref 3.8–4.9)
Alkaline Phosphatase: 58 IU/L (ref 39–117)
BUN/Creatinine Ratio: 12 (ref 9–20)
BUN: 11 mg/dL (ref 6–24)
Bilirubin Total: 0.6 mg/dL (ref 0.0–1.2)
CO2: 23 mmol/L (ref 20–29)
Calcium: 9.8 mg/dL (ref 8.7–10.2)
Chloride: 101 mmol/L (ref 96–106)
Creatinine, Ser: 0.91 mg/dL (ref 0.76–1.27)
GFR calc Af Amer: 112 mL/min/{1.73_m2} (ref >59)
GFR calc non Af Amer: 97 mL/min/{1.73_m2} (ref >59)
Globulin, Total: 2.4 g/dL (ref 1.5–4.5)
Glucose: 98 mg/dL (ref 65–99)
Potassium: 4.3 mmol/L (ref 3.5–5.2)
Sodium: 140 mmol/L (ref 134–144)
Total Protein: 6.9 g/dL (ref 6.0–8.5)

## 2020-03-26 ENCOUNTER — Telehealth: Payer: Self-pay | Admitting: Internal Medicine

## 2020-03-26 NOTE — Telephone Encounter (Signed)
Called and asked pt to schedule cpe for next year and he didn't want to schedule

## 2020-04-30 DIAGNOSIS — Z1211 Encounter for screening for malignant neoplasm of colon: Secondary | ICD-10-CM | POA: Diagnosis not present

## 2020-05-01 LAB — COLOGUARD: Cologuard: NEGATIVE

## 2020-05-05 LAB — EXTERNAL GENERIC LAB PROCEDURE: COLOGUARD: NEGATIVE

## 2020-05-05 LAB — COLOGUARD: COLOGUARD: NEGATIVE

## 2020-05-06 NOTE — Progress Notes (Signed)
Pt was advised of negative results KH °

## 2020-06-18 ENCOUNTER — Ambulatory Visit: Payer: BC Managed Care – PPO | Admitting: Family Medicine

## 2020-06-18 ENCOUNTER — Other Ambulatory Visit: Payer: Self-pay

## 2020-06-18 ENCOUNTER — Encounter: Payer: Self-pay | Admitting: Family Medicine

## 2020-06-18 VITALS — BP 102/68 | HR 78 | Temp 98.2°F | Wt 161.6 lb

## 2020-06-18 DIAGNOSIS — K4090 Unilateral inguinal hernia, without obstruction or gangrene, not specified as recurrent: Secondary | ICD-10-CM

## 2020-06-18 NOTE — Progress Notes (Signed)
   Subjective:    Patient ID: Thomas Day, male    DOB: Nov 18, 1966, 54 y.o.   MRN: [id number]  HPI Is here for consult concerning his right inguinal hernia. This was diagnosed several months ago and he is noted slowly increasing swelling and discomfort. He is now set up to see general surgery however its a least a month away and he is concerned about what he can do to help with this.   Review of Systems     Objective:   Physical Exam Alert and complaining of right inguinal pain. The lesion is approximately 4 inches in size now.       Assessment & Plan:  Inguinal hernia of right side without obstruction or gangrene We looked online to get a truss. He will order 1 through [employer]. Also discussed possible inguinal hernia with incarceration. If he has increased difficulty with abdominal pain nausea vomiting, he is to go to the emergency room. Otherwise he will use a truss and follow-up with surgery and get this squared away soon as possible.

## 2020-08-09 ENCOUNTER — Other Ambulatory Visit: Payer: Self-pay

## 2020-08-09 DIAGNOSIS — K4090 Unilateral inguinal hernia, without obstruction or gangrene, not specified as recurrent: Secondary | ICD-10-CM

## 2020-08-29 ENCOUNTER — Other Ambulatory Visit: Payer: Self-pay | Admitting: Surgery

## 2020-08-29 DIAGNOSIS — K4090 Unilateral inguinal hernia, without obstruction or gangrene, not specified as recurrent: Secondary | ICD-10-CM | POA: Diagnosis not present

## 2020-09-26 ENCOUNTER — Telehealth: Payer: Self-pay | Admitting: Family Medicine

## 2020-09-26 DIAGNOSIS — K4090 Unilateral inguinal hernia, without obstruction or gangrene, not specified as recurrent: Secondary | ICD-10-CM | POA: Diagnosis not present

## 2020-09-26 MED ORDER — EPINEPHRINE 0.3 MG/0.3ML IJ SOAJ
0.3000 mg | INTRAMUSCULAR | 1 refills | Status: DC | PRN
Start: 2020-09-26 — End: 2023-12-27

## 2020-09-26 NOTE — Telephone Encounter (Signed)
Pt's wife called and states they spoke to you last night about getting a Epi Pen for Lucas. Please send to [facility] in [place]. Pt's wife can be reached at [phone].

## 2021-06-05 DIAGNOSIS — B355 Tinea imbricata: Secondary | ICD-10-CM | POA: Diagnosis not present

## 2021-06-05 DIAGNOSIS — L0100 Impetigo, unspecified: Secondary | ICD-10-CM | POA: Diagnosis not present

## 2021-06-26 DIAGNOSIS — L308 Other specified dermatitis: Secondary | ICD-10-CM | POA: Diagnosis not present

## 2021-07-25 DIAGNOSIS — L258 Unspecified contact dermatitis due to other agents: Secondary | ICD-10-CM | POA: Diagnosis not present

## 2021-09-05 DIAGNOSIS — L308 Other specified dermatitis: Secondary | ICD-10-CM | POA: Diagnosis not present

## 2021-09-16 DIAGNOSIS — L308 Other specified dermatitis: Secondary | ICD-10-CM | POA: Diagnosis not present

## 2022-10-23 ENCOUNTER — Ambulatory Visit: Payer: BC Managed Care – PPO | Admitting: Family Medicine

## 2022-10-23 VITALS — BP 130/82 | HR 72 | Temp 97.9°F | Wt 163.0 lb

## 2022-10-23 DIAGNOSIS — E8809 Other disorders of plasma-protein metabolism, not elsewhere classified: Secondary | ICD-10-CM | POA: Diagnosis not present

## 2022-10-23 DIAGNOSIS — L308 Other specified dermatitis: Secondary | ICD-10-CM | POA: Diagnosis not present

## 2022-10-23 MED ORDER — CLOBETASOL PROPIONATE 0.05 % EX CREA: 1.0000 | TOPICAL_CREAM | Freq: Two times a day (BID) | CUTANEOUS | 5 refills | Status: DC

## 2022-10-23 NOTE — Progress Notes (Signed)
   Subjective:    Patient ID: Thomas Day, male    DOB: 1966-02-07, 56 y.o.   MRN: [id number]  HPI He is here for consult concerning continued difficulty with the rash.  He has been seen by Dr. [place] Crane who then referred him to [facility].  Biopsies were taken and apparently no diagnosis was apparent other than spongiotic dermatitis.  His main issue is pain as well as a rash and itching.  He has been using clobetasol for that.  He also will occasionally use Benadryl but this is usually when he has difficulty with alpha gal.   Review of Systems     Objective:   Physical Exam Alert and in no distress erythematous patchy lesions are noted on his torso but none on his face or legs.       Assessment & Plan:  Spongiotic dermatitis - Plan: clobetasol cream (TEMOVATE) 0.05 %  Alpha galactosidase deficiency I discussed the treatment of the dermatitis with him and the possibility of using UV light and recommend he discuss this with dermatology.  Also discussed the fact that he is obviously very sensitive to any stimulus especially regard to his alpha gal and dermatitis.  He is making an effort to avoid anything that he thinks could interfere with making the rash worse.

## 2023-10-23 ENCOUNTER — Other Ambulatory Visit: Payer: Self-pay | Admitting: Family Medicine

## 2023-10-23 DIAGNOSIS — L308 Other specified dermatitis: Secondary | ICD-10-CM

## 2023-12-27 ENCOUNTER — Encounter: Payer: Self-pay | Admitting: Family Medicine

## 2023-12-27 ENCOUNTER — Ambulatory Visit (INDEPENDENT_AMBULATORY_CARE_PROVIDER_SITE_OTHER): Payer: PRIVATE HEALTH INSURANCE | Admitting: Family Medicine

## 2023-12-27 VITALS — BP 122/74 | HR 88 | Wt 181.6 lb

## 2023-12-27 DIAGNOSIS — Z23 Encounter for immunization: Secondary | ICD-10-CM

## 2023-12-27 DIAGNOSIS — F17200 Nicotine dependence, unspecified, uncomplicated: Secondary | ICD-10-CM | POA: Diagnosis not present

## 2023-12-27 DIAGNOSIS — R1013 Epigastric pain: Secondary | ICD-10-CM | POA: Diagnosis not present

## 2023-12-27 NOTE — Progress Notes (Signed)
 Chief Complaint  Patient presents with   other    Dull pain stomach area started last Tuesday and stopped since Sat. And also had some cramping,    Patient states he presents to discuss abdominal pain that he had last week, at the request of his wife.  He states he started with abdominal pain before eating dinner on New Year's [place]. He described the pain as in the upper stomach (above the umbilicus, some discomfort to the R of the umbilicus).  No radiation to the back or elsewhere.  Pain would come and go, lasting for a minute or so, crampy, then would resolve. He would go 2-4 hours without pain. He reports he drank more water, stayed away from nuts/seeds (as there is FHx of diverticulitis), didn't eat much, though reports his appetite was normal. No n/v, bowels were normal.  Rested on Wednesday (New Year's Day), didn't do anything, continued to have pain.  That night, the pain woke him up from sleep (twice in 1 night), otherwise the pain wasn't too bothersome.  He reports that the pain stopped 2 days ago (no pain since Saturday morning).  He denies any known triggers, change in diet, medications, physical activity.  No h/o abdominal surgeries. He does have h/o bowel obstruction (treated non-surgically) at the age of 58.    PMH, PSH, SH reviewed  Smoker H/o bowel obstruction at age 4 Alpha gal--resolved a few years ago, now can eat meat without problems.  Last physical a few years ago.  FHx diverticulitis.  Outpatient Encounter Medications as of 12/27/2023  Medication Sig   clobetasol  cream (TEMOVATE ) 0.05 % APPLY TO AFFECTED AREA TWICE A DAY   [DISCONTINUED] EPINEPHrine  0.3 mg/0.3 mL IJ SOAJ injection Inject 0.3 mg into the muscle as needed for anaphylaxis. (Patient not taking: Reported on 10/23/2022)   No facility-administered encounter medications on file as of 12/27/2023.   Allergies  Allergen Reactions   Other Anaphylaxis   Tilactase Itching   ROS:  No f/c, URI symptoms,  chest pain, shortness of breath. Abdominal pain resolved, denies nausea, vomiting, any bowel changes or urinary complaints No changes to skin condition (spongiotic dermatitis) See HPI   PHYSICAL EXAM:  BP 122/74   Pulse 88   Wt 181 lb 9.6 oz (82.4 kg)   BMI 26.43 kg/m   Pleasant male, in no distress HEENT: conjunctiva and sclera are clear, EOMI. OP clear Neck: no lymphadenopathy, thyromegaly or bruit Heart: regular rate and rhythm, no murmur Lungs: clear bilaterally Back: no spinal or CVA tenderness Abdomen: soft, mildly tender in epigastrium. No hepatosplenomegaly. No rebound or guarding. Extremities: no edema Skin: fissures at corners of mouth and hyperkeratotic plaques on lower face/chin/neck Neuro: alert and oriented, cranial nerves grossly intact, normal gait Psych: normal mood, affect, hygiene and grooming   ASSESSMENT/PLAN:  Epigastric abdominal pain - advised to avoid NSAIDs. can do 2 week trial of OTC PPI if pain recurs. Checking labs for reassurance - Plan: CBC with Differential/Platelet, Comprehensive metabolic panel, Lipase  Current smoker - counseled re: risks, encouraged cessation, to think about why/when smoking  Need for Tdap vaccination - Plan: Tdap vaccine greater than or equal to 7yo IM  Updated problem list--had alpha galactosidase deficiency, when he really had allergic to alpha-gal. Allergies also updated Declines flu shot, COVID shot. Tdap given. Encouraged to set up CPE with PCP.  No labs since 02/2020. Check C-met and CBC today, along with lipase, suspect will be normal.  I spent 38 minutes dedicated to  the care of this patient, including pre-visit review of records, face to face time, post-visit ordering of testing and documentation.   We are checking some labs to ensure no underlying concerns exist that would need further evaluation. Your exam today only shows very mid tenderness in the upper stomach. This can be related to gastritis. Avoid  anti-inflammatories (advil /motrin jeronimo), using tylenol instead, if needed for pain. You can consider doing a 2 week course of Prilosec OTC if the upper stomach tenderness persists.  Return for re-evaluation if your pain recurs/worsens.  Please continue to work on quitting smoking. Setting a quit date can help, as well as evaluating why and when you smoke, to be able to come up with alternatives to smoking.

## 2023-12-27 NOTE — Patient Instructions (Signed)
 We are checking some labs to ensure no underlying concerns exist that would need further evaluation. Your exam today only shows very mid tenderness in the upper stomach. This can be related to gastritis. Avoid anti-inflammatories (advil /motrin jeronimo), using tylenol instead, if needed for pain. You can consider doing a 2 week course of Prilosec OTC if the upper stomach tenderness persists.  Return for re-evaluation if your pain recurs/worsens.  Please continue to work on quitting smoking. Setting a quit date can help, as well as evaluating why and when you smoke, to be able to come up with alternatives to smoking.

## 2023-12-28 LAB — COMPREHENSIVE METABOLIC PANEL
ALT: 19 [IU]/L (ref 0–44)
AST: 18 [IU]/L (ref 0–40)
Albumin: 4.3 g/dL (ref 3.8–4.9)
Alkaline Phosphatase: 72 [IU]/L (ref 44–121)
BUN/Creatinine Ratio: 6 — ABNORMAL LOW (ref 9–20)
BUN: 5 mg/dL — ABNORMAL LOW (ref 6–24)
Bilirubin Total: 0.4 mg/dL (ref 0.0–1.2)
CO2: 25 mmol/L (ref 20–29)
Calcium: 9.3 mg/dL (ref 8.7–10.2)
Chloride: 100 mmol/L (ref 96–106)
Creatinine, Ser: 0.8 mg/dL (ref 0.76–1.27)
Globulin, Total: 2.3 g/dL (ref 1.5–4.5)
Glucose: 118 mg/dL — ABNORMAL HIGH (ref 70–99)
Potassium: 3.8 mmol/L (ref 3.5–5.2)
Sodium: 139 mmol/L (ref 134–144)
Total Protein: 6.6 g/dL (ref 6.0–8.5)
eGFR: 104 mL/min/{1.73_m2} (ref >59)

## 2023-12-28 LAB — CBC WITH DIFFERENTIAL/PLATELET
Basophils Absolute: 0.1 10*3/uL (ref 0.0–0.2)
Basos: 1 %
EOS (ABSOLUTE): 0.2 10*3/uL (ref 0.0–0.4)
Eos: 2 %
Hematocrit: 41.7 % (ref 37.5–51.0)
Hemoglobin: 13.8 g/dL (ref 13.0–17.7)
Immature Grans (Abs): 0 10*3/uL (ref 0.0–0.1)
Immature Granulocytes: 0 %
Lymphocytes Absolute: 2.2 10*3/uL (ref 0.7–3.1)
Lymphs: 23 %
MCH: 30.1 pg (ref 26.6–33.0)
MCHC: 33.1 g/dL (ref 31.5–35.7)
MCV: 91 fL (ref 79–97)
Monocytes Absolute: 0.9 10*3/uL (ref 0.1–0.9)
Monocytes: 9 %
Neutrophils Absolute: 6.5 10*3/uL (ref 1.4–7.0)
Neutrophils: 65 %
Platelets: 348 10*3/uL (ref 150–450)
RBC: 4.59 x10E6/uL (ref 4.14–5.80)
RDW: 12.9 % (ref 11.6–15.4)
WBC: 9.8 10*3/uL (ref 3.4–10.8)

## 2023-12-28 LAB — LIPASE: Lipase: 27 U/L (ref 13–78)

## 2024-02-07 ENCOUNTER — Telehealth: Payer: Self-pay | Admitting: Family Medicine

## 2024-02-07 NOTE — Telephone Encounter (Signed)
 Pt called and he started having symptoms today and he said you told him to call an you would send in tamiflu

## 2024-02-08 ENCOUNTER — Other Ambulatory Visit: Payer: Self-pay | Admitting: Medical

## 2024-02-08 MED ORDER — OSELTAMIVIR PHOSPHATE 75 MG PO CAPS: 75.0000 mg | ORAL_CAPSULE | Freq: Two times a day (BID) | ORAL | 0 refills | Status: DC

## 2024-02-08 NOTE — Telephone Encounter (Signed)
 Patient aware.

## 2024-02-08 NOTE — Telephone Encounter (Signed)
 His wife was seen by Vincenza Hews for virtual 02/03/24 and was positive for flu.

## 2024-02-15 ENCOUNTER — Encounter: Payer: Self-pay | Admitting: Internal Medicine

## 2024-03-08 ENCOUNTER — Ambulatory Visit: Payer: PRIVATE HEALTH INSURANCE | Admitting: Family Medicine

## 2024-03-08 VITALS — BP 122/76 | HR 78 | Ht 68.5 in | Wt 182.2 lb

## 2024-03-08 DIAGNOSIS — T781XXA Other adverse food reactions, not elsewhere classified, initial encounter: Secondary | ICD-10-CM | POA: Diagnosis not present

## 2024-03-08 DIAGNOSIS — Z1159 Encounter for screening for other viral diseases: Secondary | ICD-10-CM

## 2024-03-08 DIAGNOSIS — R61 Generalized hyperhidrosis: Secondary | ICD-10-CM

## 2024-03-08 DIAGNOSIS — Z23 Encounter for immunization: Secondary | ICD-10-CM

## 2024-03-08 DIAGNOSIS — Z0000 Encounter for general adult medical examination without abnormal findings: Secondary | ICD-10-CM | POA: Diagnosis not present

## 2024-03-08 DIAGNOSIS — E8809 Other disorders of plasma-protein metabolism, not elsewhere classified: Secondary | ICD-10-CM

## 2024-03-08 DIAGNOSIS — Z1211 Encounter for screening for malignant neoplasm of colon: Secondary | ICD-10-CM

## 2024-03-08 DIAGNOSIS — L308 Other specified dermatitis: Secondary | ICD-10-CM

## 2024-03-08 DIAGNOSIS — F17200 Nicotine dependence, unspecified, uncomplicated: Secondary | ICD-10-CM | POA: Diagnosis not present

## 2024-03-08 DIAGNOSIS — Z13220 Encounter for screening for lipoid disorders: Secondary | ICD-10-CM

## 2024-03-08 NOTE — Progress Notes (Signed)
   Subjective:    Patient ID: Thomas Day, male    DOB: 17-Oct-1966, 58 y.o.   MRN: [id number]  HPI He is here for complete examination.  He does have alpha gal and seems to have this under fairly good control.  He does continue to smoke and at this point is not ready to quit.  He states that his hyperhidrosis seems to be under fairly good control.  He does see dermatology for his spongiotic dermatitis and is using clobetasol as well as some other topical meds to keep this in control as well as using doxycycline which seems to have been under fairly decent control.  Otherwise he has no particular concerns or complaints.   Review of Systems  All other systems reviewed and are negative.      Objective:    Physical Exam Alert and in no distress. Tympanic membranes and canals are normal. Pharyngeal area is normal. Neck is supple without adenopathy or thyromegaly. Cardiac exam shows a regular sinus rhythm without murmurs or gallops. Lungs are clear to auscultation.  Extensive erythema is noted on his face and arms in particular that is splotchy in nature.        Assessment & Plan:  Routine general medical examination at a health care facility  Current smoker  Allergic reaction to alpha-gal  Hyperhidrosis  Spongiotic dermatitis  Alpha galactosidase deficiency  Screening for lipid disorders - Plan: Lipid panel  Screening for colon cancer - Plan: Cologuard  Need for shingles vaccine - Plan: Zoster Recombinant (Shingrix ), CANCELED: Varicella-zoster vaccine subcutaneous  Need for hepatitis C screening test - Plan: Hepatitis C antibody  He will continue to be followed by dermatology for his skin condition.  Discussed smoking cessation and when he is ready to quit he is to let me know and I will work with him on that.  The pneumonia shot was deferred.

## 2024-03-09 ENCOUNTER — Encounter: Payer: Self-pay | Admitting: Family Medicine

## 2024-03-09 LAB — LIPID PANEL
Chol/HDL Ratio: 3.3 ratio (ref 0.0–5.0)
Cholesterol, Total: 203 mg/dL — ABNORMAL HIGH (ref 100–199)
HDL: 62 mg/dL (ref >39)
LDL Chol Calc (NIH): 114 mg/dL — ABNORMAL HIGH (ref 0–99)
Triglycerides: 157 mg/dL — ABNORMAL HIGH (ref 0–149)
VLDL Cholesterol Cal: 27 mg/dL (ref 5–40)

## 2024-03-09 LAB — HEPATITIS C ANTIBODY: Hep C Virus Ab: NONREACTIVE

## 2024-04-22 LAB — COLOGUARD

## 2024-07-01 LAB — COLOGUARD: COLOGUARD: POSITIVE — AB

## 2024-07-03 ENCOUNTER — Ambulatory Visit: Payer: Self-pay | Admitting: Family Medicine

## 2024-07-03 DIAGNOSIS — R195 Other fecal abnormalities: Secondary | ICD-10-CM

## 2024-07-11 NOTE — Addendum Note (Signed)
 Addended by: JOYCE NORLEEN BROCKS on: 07/11/2024 12:15 PM   Modules accepted: Orders

## 2024-09-22 ENCOUNTER — Ambulatory Visit

## 2024-09-22 VITALS — Ht 68.5 in | Wt 180.0 lb

## 2024-09-22 DIAGNOSIS — R195 Other fecal abnormalities: Secondary | ICD-10-CM

## 2024-09-22 MED ORDER — NA SULFATE-K SULFATE-MG SULF 17.5-3.13-1.6 GM/177ML PO SOLN: 1.0000 | Freq: Once | ORAL | 0 refills | Status: AC

## 2024-09-22 NOTE — Progress Notes (Signed)

## 2024-10-06 ENCOUNTER — Encounter: Admitting: Pediatrics

## 2024-10-17 ENCOUNTER — Encounter: Admitting: Pediatrics

## 2025-01-11 ENCOUNTER — Ambulatory Visit (HOSPITAL_BASED_OUTPATIENT_CLINIC_OR_DEPARTMENT_OTHER)
Admission: RE | Admit: 2025-01-11 | Discharge: 2025-01-11 | Disposition: A | Discharge status: Home / Self Care | Attending: Family Medicine | Admitting: Family Medicine

## 2025-01-11 ENCOUNTER — Ambulatory Visit (HOSPITAL_BASED_OUTPATIENT_CLINIC_OR_DEPARTMENT_OTHER): Admitting: Radiology

## 2025-01-11 ENCOUNTER — Encounter (HOSPITAL_BASED_OUTPATIENT_CLINIC_OR_DEPARTMENT_OTHER): Payer: Self-pay

## 2025-01-11 ENCOUNTER — Other Ambulatory Visit (HOSPITAL_BASED_OUTPATIENT_CLINIC_OR_DEPARTMENT_OTHER): Payer: Self-pay

## 2025-01-11 VITALS — BP 120/80 | HR 75 | Temp 98.5°F | Resp 20

## 2025-01-11 DIAGNOSIS — M7989 Other specified soft tissue disorders: Secondary | ICD-10-CM | POA: Diagnosis not present

## 2025-01-11 DIAGNOSIS — L03115 Cellulitis of right lower limb: Secondary | ICD-10-CM | POA: Diagnosis not present

## 2025-01-11 MED ORDER — CEPHALEXIN 500 MG PO CAPS
500.0000 mg | ORAL_CAPSULE | Freq: Four times a day (QID) | ORAL | 0 refills | Status: AC
  Filled 2025-01-11: qty 28, 7d supply, fill #0

## 2025-01-11 NOTE — ED Triage Notes (Signed)
 Pt c/o right leg swelling/pain since this morning. Denies hx of blood clots. He has taken Advil  with no relief.

## 2025-01-11 NOTE — ED Provider Notes (Signed)
 " Thomas Day    CSN: [id number] Arrival date & time: 01/11/25  1343      History   Chief Complaint Chief Complaint  Patient presents with   Leg Swelling    HPI Thomas Day is a 59 y.o. male.   Pt is a 60 year old male who presents today with c/o right leg swelling/pain since this morning. Denies hx of blood clots. He has taken Advil  with no relief.  No recent long distance traveling.  No fevers, chills.  No injury to the leg.      Past Medical History:  Diagnosis Date   Smoker     Patient Active Problem List   Diagnosis Date Noted   Alpha galactosidase deficiency 03/08/2024   Spongiotic dermatitis 10/23/2022   Hyperhidrosis 03/13/2020   Allergic reaction to alpha-gal 02/13/2015   Current smoker 02/08/2014    Past Surgical History:  Procedure Laterality Date   INGUINAL HERNIA REPAIR Right 2021   TONSILLECTOMY  age 81       Home Medications    Prior to Admission medications  Medication Sig Start Date End Date Taking? Authorizing Provider  cephALEXin  (KEFLEX ) 500 MG capsule Take 1 capsule (500 mg total) by mouth 4 (four) times daily. 01/11/25  Yes Kyrel Leighton A, FNP  clobetasol  cream (TEMOVATE ) 0.05 % APPLY TO AFFECTED AREA TWICE A DAY 10/26/23   Joyce Norleen BROCKS, MD  doxycycline (VIBRA-TABS) 100 MG tablet Take 100 mg by mouth 2 (two) times daily. 02/14/24   [provider]    Family History Family History  Problem Relation Age of Onset   Migraines Mother    Cancer Father        Leukemia   Colon cancer Maternal Aunt    COPD Maternal Grandmother    Cancer Paternal Grandfather    Diabetes Neg Hx    Rectal cancer Neg Hx    Stomach cancer Neg Hx    Esophageal cancer Neg Hx     Social History Social History[1]   Allergies   Other, Stearic acid, Tilactase, Methylisothiazolinone, and Nickel   Review of Systems Review of Systems See HPI  Physical Exam Triage Vital Signs ED Triage Vitals  Encounter Vitals Group     BP 01/11/25  1402 120/80     Girls Systolic BP Percentile --      Girls Diastolic BP Percentile --      Boys Systolic BP Percentile --      Boys Diastolic BP Percentile --      Pulse Rate 01/11/25 1402 75     Resp 01/11/25 1402 20     Temp 01/11/25 1402 98.5 F (36.9 C)     Temp Source 01/11/25 1402 Oral     SpO2 01/11/25 1402 97 %     Weight --      Height --      Head Circumference --      Peak Flow --      Pain Score 01/11/25 1401 7     Pain Loc --      Pain Education --      Exclude from Growth Chart --    No data found.  Updated Vital Signs BP 120/80 (BP Location: Right Arm)   Pulse 75   Temp 98.5 F (36.9 C) (Oral)   Resp 20   SpO2 97%   Visual Acuity Right Eye Distance:   Left Eye Distance:   Bilateral Distance:    Right Eye Near:   Left  Eye Near:    Bilateral Near:     Physical Exam Constitutional:      Appearance: Normal appearance.  Pulmonary:     Effort: Pulmonary effort is normal.  Musculoskeletal:        General: Normal range of motion.  Skin:    Comments: See picture for detail  Neurological:     Mental Status: He is alert.  Psychiatric:        Mood and Affect: Mood normal.         UC Treatments / Results  Labs (all labs ordered are listed, but only abnormal results are displayed) Labs Reviewed - No data to display  EKG   Radiology No results found.  Procedures Procedures (including critical Day time)  Medications Ordered in UC Medications - No data to display  Initial Impression / Assessment and Plan / UC Course  I have reviewed the triage vital signs and the nursing notes.  Pertinent labs & imaging results that were available during my Day of the patient were reviewed by me and considered in my medical decision making (see chart for details).     Swelling with cellulitis of right lower extremity.  Ultrasound did not show any concerns for blood clots.  Treating for cellulitis at this time based on exam.  Keflex  as prescribed.   Recommend elevate the leg and follow-up with doctor for recheck next week.  ER for worsening symptoms.  Keep the legs moisturized due to psoriasis.  Final Clinical Impressions(s) / UC Diagnoses   Final diagnoses:  Leg swelling  Cellulitis of right lower extremity     Discharge Instructions      Your ultrasound did not show any concern for blood clot.  This is cellulitis or infection in the leg.  I am treating this with antibiotics.  Take these as prescribed.  Recommend elevating the leg.  Follow-up with your doctor for recheck for continued issues. Keep the legs moisturized      ED Prescriptions     Medication Sig Dispense Auth. Provider   cephALEXin  (KEFLEX ) 500 MG capsule Take 1 capsule (500 mg total) by mouth 4 (four) times daily. 28 capsule Adah Corning A, FNP      PDMP not reviewed this encounter.     [1]  Social History Tobacco Use   Smoking status: Every Day    Current packs/day: 1.00    Types: Cigarettes   Smokeless tobacco: Never  Vaping Use   Vaping status: Never Used  Substance Use Topics   Alcohol use: Yes    Alcohol/week: 1.0 standard drink of alcohol    Types: 1 Shots of liquor per week    Comment: maybe once a week   Drug use: No     Adah Corning LABOR, FNP 01/16/25 1003  "

## 2025-01-11 NOTE — Discharge Instructions (Signed)
 Your ultrasound did not show any concern for blood clot.  This is cellulitis or infection in the leg.  I am treating this with antibiotics.  Take these as prescribed.  Recommend elevating the leg.  Follow-up with your doctor for recheck for continued issues. Keep the legs moisturized

## 2025-01-11 NOTE — Progress Notes (Signed)
 Thomas Day 59 year old male complains of right lower leg pain and swelling which started on 01/11/2024.

## 2025-01-11 NOTE — Progress Notes (Signed)
 Subjective Patient ID: Thomas Day is a 59 y.o. male.    Patient presents with concerns of pain and swelling in the right leg. He reports he noticed it yesterday and it seems worse today.  He denies any injury such as a fall, twisting his ankle, or recent change in activity. He denies prior similar or history of blood clots or other clotting problems. The patient reports his right lower leg from the knee down is diffusely swollen. It is tender and painful when he moves his leg or walks. He has not tried anything for it. The patient reports minimal discomfort in the foot and denies numbness/tingling.   History provided by:  Patient Leg Pain  Pertinent negatives include no numbness.    Review of Systems  Constitutional:  Negative for fever.  Cardiovascular:  Positive for leg swelling. Negative for chest pain.  Musculoskeletal:  Positive for gait problem.  Skin:  Negative for rash and wound.  Neurological:  Negative for weakness and numbness.    Patient History  Allergies: Allergies  Allergen Reactions   Beef Allergy Anaphylaxis   Doxycycline Swelling   Hydrocodone Swelling   Other Anaphylaxis    Meat (alpha-gal). He states this has RESOLVED   Penicillins Swelling   Stearic Acid Anaphylaxis and Swelling    Patient reports stearic acid allergy.   Swine Bile Anaphylaxis   Tilactase Hives and Itching   Methylisothiazolinone Rash    Positive Patch Test 01/22/22   Nickel Rash    Positive Patch Test 01/22/22    History reviewed. No pertinent past medical history. Past Surgical History:  Procedure Laterality Date   HERNIA REPAIR     Social History   Socioeconomic History   Marital status: Married    Spouse name: Not on file   Number of children: Not on file   Years of education: Not on file   Highest education level: Not on file  Occupational History   Not on file  Tobacco Use   Smoking status: Every Day   Smokeless tobacco: Never  Substance and Sexual  Activity   Alcohol use: Not Currently   Drug use: Never   Sexual activity: Defer  Other Topics Concern   Not on file  Social History Narrative   Not on file   History reviewed. No pertinent family history. Current Outpatient Medications on File Prior to Visit  Medication Sig Dispense Refill   Cromolyn Sodium powder 100 mg 4 times a day.     diphenhydrAMINE  (BENADryl ) 25 MG capsule Take by mouth.     EPINEPHrine  (Epipen ) 0.3 MG/0.3ML injection syringe Inject 0.3 mg into the shoulder, thigh, or buttocks.     hydrOXYzine HCl (Atarax) 25 MG tablet Take 25 mg by mouth every 4 (four) hours if needed.     No current facility-administered medications on file prior to visit.    Objective  Vitals:   01/11/25 1255  BP: 118/75  BP Location: Right arm  Patient Position: Sitting  Pulse: 82  Resp: 16  Temp: 36.6 C (97.9 F)  TempSrc: Oral  SpO2: 99%  Weight: 81.2 kg  Height: 5' 9  PainSc:   6               No results found.  Physical Exam Vitals and nursing note reviewed.  Constitutional:      General: He is not in acute distress. Musculoskeletal:     Comments: Diffuse swelling of right lower leg compared to left, from knee to ankle. Tenderness  diffusely to right lower leg, especially posterior distal aspect at distal calf above Achilles. Reports increased pain with ankle flexion. Slight erythema compared to left, but difficult to appreciate as both are slightly erythematous with very dry skin. No visible wound. Denies any point bony tenderness.  Neurological:     Mental Status: He is alert.     Gait: Gait abnormal (antalgic favoring right).  Psychiatric:        Mood and Affect: Mood normal.     No results found for this visit on 01/11/25.     Procedures MDM:     1 Undiagnosed new problem with uncertain prognosis     Explanation of Medical Decision Making and variances from expected care:  Unilateral LE swelling without known injury. Concern for DVT.  Discussed with patient unable to rule out and need for higher level of care to evaluate. Patient referred to ED for Doppler/further evaluation to r/o DVT.    Risk:: High            Assessment/Plan Diagnoses and all orders for this visit:  Edema of right lower extremity -     Referral to Emergency Medicine; Future     Disposition Status: Emergency Department  Patient Instructions  Concern for possible blood clot (DVT) causing your pain and swelling, especially since you haven't injured your leg. Go to the ER for further evaluation and treatment.  Progress note signed by Greig Doss, PA on 01/11/25 at  1:10 PM

## 2025-01-12 ENCOUNTER — Ambulatory Visit (HOSPITAL_COMMUNITY): Payer: Self-pay
